# Patient Record
Sex: Female | Born: 1985 | Race: Black or African American | Hispanic: No | Marital: Married | Smoking: Current some day smoker
Health system: Southern US, Community
[De-identification: ages and names within clinical notes are randomized; demographics above are authoritative.]

## PROBLEM LIST (undated history)

## (undated) DIAGNOSIS — R569 Unspecified convulsions: Secondary | ICD-10-CM

---
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
## 2007-04-13 ENCOUNTER — Emergency Department (HOSPITAL_COMMUNITY): Admission: EM | Admit: 2007-04-13 | Discharge: 2007-04-13 | Payer: Self-pay | Admitting: Emergency Medicine

## 2009-06-13 ENCOUNTER — Emergency Department (HOSPITAL_COMMUNITY): Admission: EM | Admit: 2009-06-13 | Discharge: 2009-06-13 | Payer: Self-pay | Admitting: Emergency Medicine

## 2009-08-02 ENCOUNTER — Other Ambulatory Visit: Admission: RE | Admit: 2009-08-02 | Discharge: 2009-08-02 | Payer: Self-pay | Admitting: Obstetrics and Gynecology

## 2010-10-09 LAB — DIFFERENTIAL
Basophils Absolute: 0 10*3/uL (ref 0.0–0.1)
Basophils Relative: 0 % (ref 0–1)
Eosinophils Absolute: 0.1 10*3/uL (ref 0.0–0.7)
Eosinophils Relative: 1 % (ref 0–5)
Lymphocytes Relative: 11 % — ABNORMAL LOW (ref 12–46)
Lymphs Abs: 1.6 10*3/uL (ref 0.7–4.0)
Monocytes Absolute: 0.9 10*3/uL (ref 0.1–1.0)
Monocytes Relative: 6 % (ref 3–12)
Neutro Abs: 11.4 10*3/uL — ABNORMAL HIGH (ref 1.7–7.7)
Neutrophils Relative %: 81 % — ABNORMAL HIGH (ref 43–77)

## 2010-10-09 LAB — URINALYSIS, ROUTINE W REFLEX MICROSCOPIC
Bilirubin Urine: NEGATIVE
Glucose, UA: NEGATIVE mg/dL
Hgb urine dipstick: NEGATIVE
Ketones, ur: >80 mg/dL — AB
Nitrite: NEGATIVE
Protein, ur: NEGATIVE mg/dL
Specific Gravity, Urine: 1.025 (ref 1.005–1.030)
Urobilinogen, UA: 0.2 mg/dL (ref 0.0–1.0)
pH: 6.5 (ref 5.0–8.0)

## 2010-10-09 LAB — BASIC METABOLIC PANEL
BUN: 7 mg/dL (ref 6–23)
CO2: 22 mEq/L (ref 19–32)
Calcium: 9.2 mg/dL (ref 8.4–10.5)
Chloride: 103 mEq/L (ref 96–112)
Creatinine, Ser: 0.53 mg/dL (ref 0.4–1.2)
GFR calc Af Amer: >60 mL/min (ref >60)
GFR calc non Af Amer: >60 mL/min (ref >60)
Glucose, Bld: 78 mg/dL (ref 70–99)
Potassium: 3.6 mEq/L (ref 3.5–5.1)
Sodium: 134 mEq/L — ABNORMAL LOW (ref 135–145)

## 2010-10-09 LAB — CBC
HCT: 34.6 % — ABNORMAL LOW (ref 36.0–46.0)
Hemoglobin: 11.7 g/dL — ABNORMAL LOW (ref 12.0–15.0)
MCHC: 33.8 g/dL (ref 30.0–36.0)
MCV: 80.2 fL (ref 78.0–100.0)
Platelets: 262 10*3/uL (ref 150–400)
RBC: 4.31 MIL/uL (ref 3.87–5.11)
RDW: 17.3 % — ABNORMAL HIGH (ref 11.5–15.5)
WBC: 14 10*3/uL — ABNORMAL HIGH (ref 4.0–10.5)

## 2010-10-09 LAB — PREGNANCY, URINE: Preg Test, Ur: POSITIVE

## 2010-11-25 ENCOUNTER — Emergency Department (HOSPITAL_COMMUNITY): Payer: Self-pay

## 2010-11-25 ENCOUNTER — Emergency Department (HOSPITAL_COMMUNITY)
Admission: EM | Admit: 2010-11-25 | Discharge: 2010-11-25 | Disposition: A | Discharge status: Home / Self Care | Payer: Self-pay | Attending: Emergency Medicine | Admitting: Emergency Medicine

## 2010-11-25 DIAGNOSIS — M79609 Pain in unspecified limb: Secondary | ICD-10-CM | POA: Insufficient documentation

## 2010-11-25 DIAGNOSIS — IMO0002 Reserved for concepts with insufficient information to code with codable children: Secondary | ICD-10-CM | POA: Insufficient documentation

## 2010-11-25 DIAGNOSIS — S60229A Contusion of unspecified hand, initial encounter: Secondary | ICD-10-CM | POA: Insufficient documentation

## 2011-04-15 ENCOUNTER — Other Ambulatory Visit: Payer: Self-pay

## 2011-04-18 LAB — WET PREP, GENITAL
Trich, Wet Prep: NONE SEEN
Yeast Wet Prep HPF POC: NONE SEEN

## 2011-04-18 LAB — URINALYSIS, ROUTINE W REFLEX MICROSCOPIC
Bilirubin Urine: NEGATIVE
Glucose, UA: NEGATIVE
Hgb urine dipstick: NEGATIVE
Ketones, ur: NEGATIVE
Nitrite: NEGATIVE
Protein, ur: NEGATIVE
Specific Gravity, Urine: 1.01
Urobilinogen, UA: 0.2
pH: 6

## 2011-04-18 LAB — GC/CHLAMYDIA PROBE AMP, GENITAL
Chlamydia, DNA Probe: POSITIVE — AB
GC Probe Amp, Genital: NEGATIVE

## 2011-04-18 LAB — PREGNANCY, URINE: Preg Test, Ur: NEGATIVE

## 2011-08-12 ENCOUNTER — Other Ambulatory Visit (HOSPITAL_COMMUNITY): Payer: Self-pay | Admitting: Unknown Physician Specialty

## 2011-08-12 DIAGNOSIS — O2690 Pregnancy related conditions, unspecified, unspecified trimester: Secondary | ICD-10-CM

## 2011-08-13 ENCOUNTER — Encounter (HOSPITAL_COMMUNITY): Payer: Self-pay

## 2011-08-13 ENCOUNTER — Ambulatory Visit (HOSPITAL_COMMUNITY)
Admission: RE | Admit: 2011-08-13 | Discharge: 2011-08-13 | Disposition: A | Discharge status: Home / Self Care | Payer: Medicaid Other | Source: Ambulatory Visit | Attending: Unknown Physician Specialty | Admitting: Unknown Physician Specialty

## 2011-08-13 DIAGNOSIS — Z363 Encounter for antenatal screening for malformations: Secondary | ICD-10-CM | POA: Insufficient documentation

## 2011-08-13 DIAGNOSIS — O358XX0 Maternal care for other (suspected) fetal abnormality and damage, not applicable or unspecified: Secondary | ICD-10-CM | POA: Insufficient documentation

## 2011-08-13 DIAGNOSIS — O34219 Maternal care for unspecified type scar from previous cesarean delivery: Secondary | ICD-10-CM | POA: Insufficient documentation

## 2011-08-13 DIAGNOSIS — Z1389 Encounter for screening for other disorder: Secondary | ICD-10-CM | POA: Insufficient documentation

## 2011-08-13 DIAGNOSIS — O2690 Pregnancy related conditions, unspecified, unspecified trimester: Secondary | ICD-10-CM

## 2011-08-13 NOTE — Progress Notes (Signed)
Sherry Greene was seen for ultrasound appointment today.  Please see [facility] report for details.

## 2011-09-10 ENCOUNTER — Ambulatory Visit (HOSPITAL_COMMUNITY)
Admission: RE | Admit: 2011-09-10 | Payer: Medicaid Other | Source: Ambulatory Visit | Attending: Family Medicine | Admitting: Family Medicine

## 2011-10-02 ENCOUNTER — Ambulatory Visit (HOSPITAL_COMMUNITY): Payer: Medicaid Other

## 2014-03-15 ENCOUNTER — Emergency Department (HOSPITAL_COMMUNITY): Payer: Self-pay

## 2014-03-15 ENCOUNTER — Emergency Department (HOSPITAL_COMMUNITY)
Admission: EM | Admit: 2014-03-15 | Discharge: 2014-03-15 | Disposition: A | Discharge status: Home / Self Care | Payer: Self-pay | Attending: Emergency Medicine | Admitting: Emergency Medicine

## 2014-03-15 ENCOUNTER — Encounter (HOSPITAL_COMMUNITY): Payer: Self-pay | Admitting: Emergency Medicine

## 2014-03-15 DIAGNOSIS — X500XXA Overexertion from strenuous movement or load, initial encounter: Secondary | ICD-10-CM | POA: Insufficient documentation

## 2014-03-15 DIAGNOSIS — R000 Tachycardia, unspecified: Secondary | ICD-10-CM | POA: Insufficient documentation

## 2014-03-15 DIAGNOSIS — Y929 Unspecified place or not applicable: Secondary | ICD-10-CM | POA: Insufficient documentation

## 2014-03-15 DIAGNOSIS — S99919A Unspecified injury of unspecified ankle, initial encounter: Secondary | ICD-10-CM

## 2014-03-15 DIAGNOSIS — S8990XA Unspecified injury of unspecified lower leg, initial encounter: Secondary | ICD-10-CM | POA: Insufficient documentation

## 2014-03-15 DIAGNOSIS — Y9302 Activity, running: Secondary | ICD-10-CM | POA: Insufficient documentation

## 2014-03-15 DIAGNOSIS — S99929A Unspecified injury of unspecified foot, initial encounter: Secondary | ICD-10-CM

## 2014-03-15 DIAGNOSIS — S93402A Sprain of unspecified ligament of left ankle, initial encounter: Secondary | ICD-10-CM

## 2014-03-15 DIAGNOSIS — S93409A Sprain of unspecified ligament of unspecified ankle, initial encounter: Secondary | ICD-10-CM | POA: Insufficient documentation

## 2014-03-15 DIAGNOSIS — F17200 Nicotine dependence, unspecified, uncomplicated: Secondary | ICD-10-CM | POA: Insufficient documentation

## 2014-03-15 MED ORDER — HYDROCODONE-ACETAMINOPHEN 5-325 MG PO TABS: 1.0000 | ORAL_TABLET | ORAL | Status: DC | PRN

## 2014-03-15 MED ORDER — NAPROXEN 500 MG PO TABS: 500.0000 mg | ORAL_TABLET | Freq: Two times a day (BID) | ORAL | Status: DC

## 2014-03-15 NOTE — ED Notes (Signed)
Pt stumbled and now c/o left ankle pain. No swelling noted. Pedal pulses strong. Pt rates pain 10. Did not take anything for pain pta.

## 2014-03-15 NOTE — ED Provider Notes (Signed)
CSN: [id number]     Arrival date & time 03/15/14  1417 History   None    Chief Complaint  Patient presents with  . Ankle Pain     (Consider location/radiation/quality/duration/timing/severity/associated sxs/prior Treatment) Patient is a 28 y.o. female presenting with ankle pain. The history is provided by the patient.  Ankle Pain Location:  Ankle Injury: yes   Ankle location:  L ankle Pain details:    Quality:  Sharp and throbbing   Onset quality:  Sudden   Duration:  4 hours   Progression:  Unchanged Chronicity:  New Dislocation: no   Foreign body present:  No foreign bodies Prior injury to area:  No Relieved by:  None tried Worsened by:  Activity and bearing weight Ineffective treatments:  None tried Associated symptoms: swelling    Sherry Greene is a 28 y.o. female who presents to the ED with left ankle pain. She states that she was running and twisted her left ankle. She complains of swelling. She has taken nothing for pain.   History reviewed. No pertinent past medical history. Past Surgical History  Procedure Laterality Date  . Cesarean section     History reviewed. No pertinent family history. History  Substance Use Topics  . Smoking status: Current Every Day Smoker  . Smokeless tobacco: Not on file  . Alcohol Use: No   OB History   Grav Para Term Preterm Abortions TAB SAB Ect Mult Living       Review of Systems Negative except as stated in HPI   Allergies  Review of patient's allergies indicates no known allergies.  Home Medications   Prior to Admission medications   Medication Sig Start Date End Date Taking? Authorizing Provider  PRENATAL VITAMINS PO Take by mouth.    Historical Provider, MD   BP 110/88  Pulse 126  Temp(Src) 98.3 F (36.8 C) (Oral)  Resp 20  Ht  (1.676 m)  Wt 175 lb (79.379 kg)  BMI 28.26 kg/m2  SpO2 100%  LMP 03/13/2014  Breastfeeding? Unknown Physical Exam  Nursing note and vitals  reviewed. Constitutional: She is oriented to person, place, and time. She appears well-developed and well-nourished. No distress.  HENT:  Head: Normocephalic and atraumatic.  Eyes: EOM are normal.  Neck: Neck supple.  Cardiovascular: Tachycardia present.   Pulmonary/Chest: Effort normal.  Abdominal: Soft. There is no tenderness.  Musculoskeletal: Normal range of motion.       Left ankle: She exhibits swelling (minimal). She exhibits normal range of motion, no ecchymosis, no deformity, no laceration and normal pulse. Tenderness. Lateral malleolus and medial malleolus tenderness found. Achilles tendon normal.  Pedal pulses strong, adequate circulation, good touch sensation.   Neurological: She is alert and oriented to person, place, and time. No cranial nerve deficit.  Skin: Skin is warm and dry.  Psychiatric: She has a normal mood and affect. Her behavior is normal.    ED Course  Procedures (including critical care time) Labs Review Labs Reviewed - No data to display  Imaging Review Dg Ankle Complete Left  03/15/2014   CLINICAL DATA:  Twisted ankle today, now with lateral ankle pain and swelling  EXAM: LEFT ANKLE COMPLETE - 3+ VIEW  COMPARISON:  None.  FINDINGS: There is mild soft tissue swelling about the medial malleolus. No fracture or dislocation. Joint spaces are preserved. Ankle mortise is preserved. No definite ankle joint effusion. No radiopaque foreign body.  IMPRESSION: Mild soft  tissue swelling about the medial malleolus without associated fracture or dislocation.   Electronically Signed   By: Simonne Come M.D.   On: 03/15/2014 14:53     MDM  28 y.o. female with left ankle pain s/p injury. Placed in ASO, crutches to use with ambulation, pain management and follow up with ortho if symptoms persist. I have reviewed this patient's vital signs, nurses notes, appropriate imaging and discussed findings with the patient and plan of care. She voices understanding and agrees with plan.     Medication List         HYDROcodone-acetaminophen 5-325 MG per tablet  Commonly known as:  NORCO/VICODIN  Take 1 tablet by mouth every 4 (four) hours as needed.     naproxen 500 MG tablet  Commonly known as:  NAPROSYN  Take 1 tablet (500 mg total) by mouth 2 (two) times daily.             [facility] Orlene Och, [place] 03/15/14 [phone]

## 2014-03-16 NOTE — ED Provider Notes (Signed)
Medical screening examination/treatment/procedure(s) were performed by non-physician practitioner and as supervising physician I was immediately available for consultation/collaboration.    Jesseca Marsch, MD 03/16/14 0955 

## 2014-05-09 ENCOUNTER — Encounter (HOSPITAL_COMMUNITY): Payer: Self-pay | Admitting: Emergency Medicine

## 2015-06-11 ENCOUNTER — Emergency Department (HOSPITAL_COMMUNITY)
Admission: EM | Admit: 2015-06-11 | Discharge: 2015-06-11 | Disposition: A | Discharge status: Home / Self Care | Payer: Self-pay | Attending: Emergency Medicine | Admitting: Emergency Medicine

## 2015-06-11 ENCOUNTER — Encounter (HOSPITAL_COMMUNITY): Payer: Self-pay | Admitting: Emergency Medicine

## 2015-06-11 ENCOUNTER — Emergency Department (HOSPITAL_COMMUNITY): Payer: Self-pay

## 2015-06-11 DIAGNOSIS — R569 Unspecified convulsions: Secondary | ICD-10-CM | POA: Insufficient documentation

## 2015-06-11 DIAGNOSIS — R51 Headache: Secondary | ICD-10-CM | POA: Insufficient documentation

## 2015-06-11 DIAGNOSIS — F17210 Nicotine dependence, cigarettes, uncomplicated: Secondary | ICD-10-CM | POA: Insufficient documentation

## 2015-06-11 DIAGNOSIS — Z791 Long term (current) use of non-steroidal anti-inflammatories (NSAID): Secondary | ICD-10-CM | POA: Insufficient documentation

## 2015-06-11 LAB — CBC WITH DIFFERENTIAL/PLATELET
Basophils Absolute: 0 10*3/uL (ref 0.0–0.1)
Basophils Relative: 0 %
Eosinophils Absolute: 0.1 10*3/uL (ref 0.0–0.7)
Eosinophils Relative: 1 %
HCT: 36.4 % (ref 36.0–46.0)
Hemoglobin: 12.3 g/dL (ref 12.0–15.0)
Lymphocytes Relative: 18 %
Lymphs Abs: 2.5 10*3/uL (ref 0.7–4.0)
MCH: 28.5 pg (ref 26.0–34.0)
MCHC: 33.8 g/dL (ref 30.0–36.0)
MCV: 84.3 fL (ref 78.0–100.0)
Monocytes Absolute: 1 10*3/uL (ref 0.1–1.0)
Monocytes Relative: 7 %
Neutro Abs: 10.2 10*3/uL — ABNORMAL HIGH (ref 1.7–7.7)
Neutrophils Relative %: 74 %
Platelets: 295 10*3/uL (ref 150–400)
RBC: 4.32 MIL/uL (ref 3.87–5.11)
RDW: 16.4 % — ABNORMAL HIGH (ref 11.5–15.5)
WBC: 13.8 10*3/uL — ABNORMAL HIGH (ref 4.0–10.5)

## 2015-06-11 LAB — BASIC METABOLIC PANEL
Anion gap: 6 (ref 5–15)
BUN: 11 mg/dL (ref 6–20)
CO2: 26 mmol/L (ref 22–32)
Calcium: 9.3 mg/dL (ref 8.9–10.3)
Chloride: 105 mmol/L (ref 101–111)
Creatinine, Ser: 0.67 mg/dL (ref 0.44–1.00)
GFR calc Af Amer: >60 mL/min (ref >60)
GFR calc non Af Amer: >60 mL/min (ref >60)
Glucose, Bld: 87 mg/dL (ref 65–99)
Potassium: 3.9 mmol/L (ref 3.5–5.1)
Sodium: 137 mmol/L (ref 135–145)

## 2015-06-11 MED ORDER — KETOROLAC TROMETHAMINE 30 MG/ML IJ SOLN
15.0000 mg | Freq: Once | INTRAMUSCULAR | Status: AC
  Administered 2015-06-11: 15 mg via INTRAVENOUS
  Filled 2015-06-11: qty 1

## 2015-06-11 NOTE — Discharge Instructions (Signed)

## 2015-06-11 NOTE — ED Provider Notes (Signed)
CSN: [id number]     Arrival date & time 06/11/15  1525 History   First MD Initiated Contact with Patient 06/11/15 1546     Chief Complaint  Patient presents with  . Seizures     (Consider location/radiation/quality/duration/timing/severity/associated sxs/prior Treatment) HPI   29 year old female possible seizure. Happened shortly before arrival. Patient is amnestic to actual events. Apparently she became unresponsive in front of family. Have generalized shaking and seems stiff. Drooling. No incontinence or oral trauma. After shaking subsided patient was nonverbal for ~15 minutes and when she did begin speaking was very confused. Now feels much better and no further complaints. Felt fine when woke up and over the past few days. No fever. Sleeping well. No hx of similar episodes.    History reviewed. No pertinent past medical history. Past Surgical History  Procedure Laterality Date  . Cesarean section     History reviewed. No pertinent family history. Social History  Substance Use Topics  . Smoking status: Current Every Day Smoker    Types: Cigars  . Smokeless tobacco: Never Used  . Alcohol Use: No   OB History    Gravida Para Term Preterm AB TAB SAB Ectopic Multiple Living   2 1 0 0 0 0 0 0 0 1      Review of Systems  All systems reviewed and negative, other than as noted in HPI.   Allergies  Review of patient's allergies indicates no known allergies.  Home Medications   Prior to Admission medications   Medication Sig Start Date End Date Taking? Authorizing Provider  HYDROcodone-acetaminophen (NORCO/VICODIN) 5-325 MG per tablet Take 1 tablet by mouth every 4 (four) hours as needed. 03/15/14   Hope Orlene OchM Neese, NP  naproxen (NAPROSYN) 500 MG tablet Take 1 tablet (500 mg total) by mouth 2 (two) times daily. 03/15/14   Hope Orlene OchM Neese, NP   BP 129/88 mmHg  Pulse 83  Temp(Src) 97.5 F (36.4 C) (Oral)  Resp 20  Ht 5\' 4"  (1.626 m)  Wt 160 lb (72.576 kg)  BMI 27.45 kg/m2  SpO2  100%  LMP 05/19/2015 Physical Exam  Constitutional: She is oriented to person, place, and time. She appears well-developed and well-nourished. No distress.  HENT:  Head: Normocephalic and atraumatic.  Eyes: Conjunctivae and EOM are normal. Pupils are equal, round, and reactive to light. Right eye exhibits no discharge. Left eye exhibits no discharge.  Neck: Neck supple.  No nuchal rigidity  Cardiovascular: Normal rate, regular rhythm and normal heart sounds.  Exam reveals no gallop and no friction rub.   No murmur heard. Pulmonary/Chest: Effort normal and breath sounds normal. No respiratory distress.  Abdominal: Soft. She exhibits no distension. There is no tenderness.  Musculoskeletal: She exhibits no edema or tenderness.  Neurological: She is alert and oriented to person, place, and time. No cranial nerve deficit. She exhibits normal muscle tone. Coordination normal.  Speech clear. Content appropriate. Follows commands. Cranial nerves II through XII are intact. Strength is normal bilateral upper lower extremities. Sensation is intact to light touch. Good finger to nose testing bilaterally. Gait is steady.  Skin: Skin is warm and dry.  Psychiatric: She has a normal mood and affect. Her behavior is normal. Thought content normal.  Nursing note and vitals reviewed.   ED Course  Procedures (including critical care time) Labs Review Labs Reviewed  CBC WITH DIFFERENTIAL/PLATELET - Abnormal; Notable for the following:    WBC 13.8 (*)    RDW 16.4 (*)    Neutro  Abs 10.2 (*)    All other components within normal limits  BASIC METABOLIC PANEL    Imaging Review Ct Head Wo Contrast  06/11/2015  CLINICAL DATA:  Possible seizure. Headache and generalized weakness. EXAM: CT HEAD WITHOUT CONTRAST TECHNIQUE: Contiguous axial images were obtained from the base of the skull through the vertex without intravenous contrast. COMPARISON:  None. FINDINGS: Brain: No evidence of acute infarction,  hemorrhage, extra-axial collection, ventriculomegaly, or mass effect. Cystic structure in the posterior most aspect of the left parietal lobe is noted. Vascular: No hyperdense vessel or unexpected calcification. Skull: Negative for fracture or focal lesion. Sinuses/Orbits: No acute findings. Other: None. IMPRESSION: Normal appearance of the head, apart from cystic structure in the posterior most left parietal lobe, with benign appearance. Differential diagnosis includes subarachnoid cyst versus a dilated vascular space. Electronically Signed   By: Ted Mcalpine M.D.   On: 06/11/2015 17:14   I have personally reviewed and evaluated these images and lab results as part of my medical decision-making.   EKG Interpretation None      MDM   Final diagnoses:  Seizure-like activity (HCC)    29 year old female with what sounds like possibly new-onset seizure. Back to baseline. No further complaints. Nonfocal neurological examination. Workup including CT the head does not show any acute abnormality. Driving restrictions were discussed. Urology follow-up.    Raeford Razor, MD 06/11/15 [phone]

## 2015-06-11 NOTE — ED Notes (Signed)
Patient reports possible seizure. No hx. Per patient syncopal episode with jerking witnessed by friend in which EMS was called but patient was unable to verbally respond to paramedic while there. Per patient headache and generalized weakness.

## 2015-06-11 NOTE — ED Notes (Signed)
Pt d/c papers given and reviewed. Pt. Verbalized understanding.  

## 2015-09-27 ENCOUNTER — Encounter (HOSPITAL_COMMUNITY): Payer: Self-pay | Admitting: *Deleted

## 2015-09-27 DIAGNOSIS — R531 Weakness: Secondary | ICD-10-CM | POA: Insufficient documentation

## 2015-09-27 DIAGNOSIS — R569 Unspecified convulsions: Secondary | ICD-10-CM | POA: Insufficient documentation

## 2015-09-27 DIAGNOSIS — F17210 Nicotine dependence, cigarettes, uncomplicated: Secondary | ICD-10-CM | POA: Insufficient documentation

## 2015-09-27 DIAGNOSIS — Z3202 Encounter for pregnancy test, result negative: Secondary | ICD-10-CM | POA: Insufficient documentation

## 2015-09-27 LAB — BASIC METABOLIC PANEL
ANION GAP: 11 (ref 5–15)
BUN: 12 mg/dL (ref 6–20)
CALCIUM: 9.3 mg/dL (ref 8.9–10.3)
CO2: 22 mmol/L (ref 22–32)
Chloride: 107 mmol/L (ref 101–111)
Creatinine, Ser: 0.73 mg/dL (ref 0.44–1.00)
GLUCOSE: 100 mg/dL — AB (ref 65–99)
Potassium: 3.9 mmol/L (ref 3.5–5.1)
Sodium: 140 mmol/L (ref 135–145)

## 2015-09-27 LAB — CBC
HCT: 34.2 % — ABNORMAL LOW (ref 36.0–46.0)
HEMOGLOBIN: 11.3 g/dL — AB (ref 12.0–15.0)
MCH: 26.9 pg (ref 26.0–34.0)
MCHC: 33 g/dL (ref 30.0–36.0)
MCV: 81.4 fL (ref 78.0–100.0)
Platelets: 295 10*3/uL (ref 150–400)
RBC: 4.2 MIL/uL (ref 3.87–5.11)
RDW: 16.3 % — ABNORMAL HIGH (ref 11.5–15.5)
WBC: 10.1 10*3/uL (ref 4.0–10.5)

## 2015-09-27 LAB — I-STAT BETA HCG BLOOD, ED (MC, WL, AP ONLY)

## 2015-09-27 LAB — CBG MONITORING, ED: Glucose-Capillary: 82 mg/dL (ref 65–99)

## 2015-09-27 NOTE — ED Notes (Signed)
Pt states that she has been having seizures in her sleep for 2 months. States her husband calls the ambulance but she refuses to come to the hospital. States she has been feeling weak after having them.

## 2015-09-28 ENCOUNTER — Emergency Department (HOSPITAL_COMMUNITY)
Admission: EM | Admit: 2015-09-28 | Discharge: 2015-09-28 | Disposition: A | Discharge status: Home / Self Care | Payer: Self-pay | Attending: Emergency Medicine | Admitting: Emergency Medicine

## 2015-09-28 DIAGNOSIS — R569 Unspecified convulsions: Secondary | ICD-10-CM

## 2015-09-28 NOTE — ED Provider Notes (Signed)
CSN: [id number]     Arrival date & time 09/27/15  1945 History  By signing my name below, I, Doreatha MartinEva Mathews, attest that this documentation has been prepared under the direction and in the presence of Marily MemosJason Micky Sheller, MD. Electronically Signed: Doreatha MartinEva Mathews, ED Scribe. 09/28/2015. 12:53 AM.    Chief Complaint  Patient presents with  . Seizures   The history is provided by the patient and the spouse. No language interpreter was used.   HPI Comments: Sherry Greene is a 30 y.o. female who presents to the Emergency Department complaining of multiple episodes of seizure-like activity while sleeping, witnessed by husband, for 2 months (last episode last night). Per husband, the pts torso shakes and her mouth foams during the episodes. He reports that he turns her on her side while she is convulsing. He reports that the seizure activity seems to be related to chocolate consumption before bed. Pt reports transient generalized weakness after seizure activity that lasts hours. She states her husband has called EMS with these episodes and her CBG has been normal, but she has refused to come to the hospital. No h/o similar symptoms prior to 2 months ago. FHx of seizures from grandmother after brain aneurisms. Pt has not been followed by neurology. No recent illness or increased stress. LMP this month, no missed periods. She denies fever, abdominal pain.  History reviewed. No pertinent past medical history. Past Surgical History  Procedure Laterality Date  . Cesarean section     No family history on file. Social History  Substance Use Topics  . Smoking status: Current Every Day Smoker    Types: Cigars  . Smokeless tobacco: Never Used  . Alcohol Use: No   OB History    Gravida Para Term Preterm AB TAB SAB Ectopic Multiple Living   2 1 0 0 0 0 0 0 0 1      Review of Systems  Constitutional: Negative for fever.  Gastrointestinal: Negative for abdominal pain.  Neurological: Positive for seizures (  intermittent) and weakness ( with seizure activity).  All other systems reviewed and are negative.  Allergies  Review of patient's allergies indicates no known allergies.  Home Medications   Prior to Admission medications   Not on File   BP 113/84 mmHg  Pulse 80  Temp(Src) 98.7 F (37.1 C) (Oral)  Resp 20  SpO2 100%  LMP 09/06/2015 Physical Exam  Constitutional: She is oriented to person, place, and time. She appears well-developed and well-nourished.  HENT:  Head: Normocephalic and atraumatic.  Eyes: Conjunctivae and EOM are normal. Pupils are equal, round, and reactive to light.  Neck: Normal range of motion. Neck supple.  Cardiovascular: Regular rhythm and normal heart sounds.  Exam reveals no gallop and no friction rub.   No murmur heard. Pulmonary/Chest: Effort normal and breath sounds normal. No respiratory distress. She has no wheezes. She has no rales.  Abdominal: Soft. Bowel sounds are normal. She exhibits no distension and no mass. There is no tenderness. There is no rebound and no guarding.  Musculoskeletal: Normal range of motion.  Neurological: She is alert and oriented to person, place, and time. She has normal reflexes. She displays normal reflexes. No cranial nerve deficit. Coordination normal.  Cranial nerves 2-12 intact. Normal finger to nose testing. Strength and sensation equal and intact bilaterally throughout the upper and lower extremities.Normal gait.  DTRs 2+ bilaterally in patella in biceps.   Skin: Skin is warm and dry.  Psychiatric: She has a normal mood  and affect. Her behavior is normal.  Nursing note and vitals reviewed.   ED Course  Procedures (including critical care time) DIAGNOSTIC STUDIES: Oxygen Saturation is 100% on RA, normal by my interpretation.    COORDINATION OF CARE: 12:52 AM Discussed treatment plan with pt at bedside which includes lab work, EKG, neurology follow up and pt agreed to plan.   Labs Review Labs Reviewed  BASIC  METABOLIC PANEL - Abnormal; Notable for the following:    Glucose, Bld 100 (*)    All other components within normal limits  CBC - Abnormal; Notable for the following:    Hemoglobin 11.3 (*)    HCT 34.2 (*)    RDW 16.3 (*)    All other components within normal limits  CBG MONITORING, ED  I-STAT BETA HCG BLOOD, ED ([facility], [facility], [facility] ONLY)    I have personally reviewed and evaluated these lab results as part of my medical decision-making.   EKG Interpretation None      MDM   Final diagnoses:  Seizure-like activity (HCC)    Seizure like activity only at night when sleeping. Differential is short (hypoglycemia, seizure, nocturnal myoclonic jerking, MS) so it seems neurology follow up is indicated. Will not start antiepileptics yet.  New Prescriptions: New Prescriptions   No medications on file     I have personally and contemperaneously reviewed labs and imaging and used in my decision making as above.   A medical screening exam was performed and I feel the patient has had an appropriate workup for their chief complaint at this time and likelihood of emergent condition existing is low. Their vital signs are stable. They have been counseled on decision, discharge, follow up and which symptoms necessitate immediate return to the emergency department.  They verbally stated understanding and agreement with plan and discharged in stable condition.   I personally performed the services described in this documentation, which was scribed in my presence. The recorded information has been reviewed and is accurate.    Marily Memos, MD 09/28/15 [phone]

## 2015-10-04 ENCOUNTER — Emergency Department (HOSPITAL_COMMUNITY)
Admission: EM | Admit: 2015-10-04 | Discharge: 2015-10-04 | Disposition: A | Discharge status: Home / Self Care | Payer: Self-pay | Attending: Dermatology | Admitting: Dermatology

## 2015-10-04 ENCOUNTER — Encounter (HOSPITAL_COMMUNITY): Payer: Self-pay | Admitting: Emergency Medicine

## 2015-10-04 DIAGNOSIS — Z5321 Procedure and treatment not carried out due to patient leaving prior to being seen by health care provider: Secondary | ICD-10-CM | POA: Insufficient documentation

## 2015-10-04 DIAGNOSIS — R569 Unspecified convulsions: Secondary | ICD-10-CM | POA: Insufficient documentation

## 2015-10-04 DIAGNOSIS — F17210 Nicotine dependence, cigarettes, uncomplicated: Secondary | ICD-10-CM | POA: Insufficient documentation

## 2015-10-04 HISTORY — DX: Unspecified convulsions: R56.9

## 2015-10-04 LAB — CBC WITH DIFFERENTIAL/PLATELET
BASOS PCT: 0 %
Basophils Absolute: 0 10*3/uL (ref 0.0–0.1)
EOS PCT: 1 %
Eosinophils Absolute: 0.2 10*3/uL (ref 0.0–0.7)
HEMATOCRIT: 33.4 % — AB (ref 36.0–46.0)
Hemoglobin: 11.3 g/dL — ABNORMAL LOW (ref 12.0–15.0)
Lymphocytes Relative: 21 %
Lymphs Abs: 2.4 10*3/uL (ref 0.7–4.0)
MCH: 28 pg (ref 26.0–34.0)
MCHC: 33.8 g/dL (ref 30.0–36.0)
MCV: 82.9 fL (ref 78.0–100.0)
MONO ABS: 0.9 10*3/uL (ref 0.1–1.0)
MONOS PCT: 8 %
NEUTROS ABS: 7.8 10*3/uL — AB (ref 1.7–7.7)
Neutrophils Relative %: 70 %
PLATELETS: 268 10*3/uL (ref 150–400)
RBC: 4.03 MIL/uL (ref 3.87–5.11)
RDW: 16.8 % — AB (ref 11.5–15.5)
WBC: 11.2 10*3/uL — ABNORMAL HIGH (ref 4.0–10.5)

## 2015-10-04 LAB — BASIC METABOLIC PANEL
ANION GAP: 7 (ref 5–15)
BUN: 12 mg/dL (ref 6–20)
CALCIUM: 9 mg/dL (ref 8.9–10.3)
CHLORIDE: 106 mmol/L (ref 101–111)
CO2: 25 mmol/L (ref 22–32)
Creatinine, Ser: 0.72 mg/dL (ref 0.44–1.00)
GFR calc non Af Amer: >60 mL/min (ref >60)
Glucose, Bld: 98 mg/dL (ref 65–99)
POTASSIUM: 3.8 mmol/L (ref 3.5–5.1)
Sodium: 138 mmol/L (ref 135–145)

## 2015-10-04 NOTE — ED Notes (Signed)
Pt was called no answer 

## 2015-10-04 NOTE — ED Notes (Signed)
Pt left facility per registration. 

## 2015-10-04 NOTE — ED Notes (Signed)
Pt states she has been having seizures 2-3 months now regularly. EMS was called out today and she refused to be transferred to facility.

## 2016-01-03 ENCOUNTER — Emergency Department (HOSPITAL_COMMUNITY)
Admission: EM | Admit: 2016-01-03 | Discharge: 2016-01-03 | Disposition: A | Discharge status: Home / Self Care | Payer: Self-pay | Attending: Emergency Medicine | Admitting: Emergency Medicine

## 2016-01-03 ENCOUNTER — Encounter (HOSPITAL_COMMUNITY): Payer: Self-pay | Admitting: Emergency Medicine

## 2016-01-03 DIAGNOSIS — Z5321 Procedure and treatment not carried out due to patient leaving prior to being seen by health care provider: Secondary | ICD-10-CM | POA: Insufficient documentation

## 2016-01-03 DIAGNOSIS — G40909 Epilepsy, unspecified, not intractable, without status epilepticus: Secondary | ICD-10-CM | POA: Insufficient documentation

## 2016-01-03 DIAGNOSIS — F17210 Nicotine dependence, cigarettes, uncomplicated: Secondary | ICD-10-CM | POA: Insufficient documentation

## 2016-01-03 LAB — CBG MONITORING, ED: Glucose-Capillary: 86 mg/dL (ref 65–99)

## 2016-01-03 NOTE — ED Notes (Signed)
Called for pt, no response, asked registration and reported that pt had left

## 2016-01-03 NOTE — ED Notes (Addendum)
Pt reports having a witnessed seizure while asleep in her bed. When asked how patient knew she had a seizure, pt reported that her children have witnessed them. States that she has been evaluated here multiple times, but told there is nothing wrong. Pt AOx4. Denies any incontinence. States she does not take seizure medications because "no one will help me." C/o HA and generalized weakness.

## 2016-02-21 ENCOUNTER — Encounter (HOSPITAL_COMMUNITY): Payer: Self-pay | Admitting: Cardiology

## 2016-02-21 ENCOUNTER — Emergency Department (HOSPITAL_COMMUNITY)
Admission: EM | Admit: 2016-02-21 | Discharge: 2016-02-21 | Disposition: A | Discharge status: Home / Self Care | Payer: Self-pay | Attending: Emergency Medicine | Admitting: Emergency Medicine

## 2016-02-21 ENCOUNTER — Emergency Department (HOSPITAL_COMMUNITY): Payer: Self-pay

## 2016-02-21 DIAGNOSIS — Z87891 Personal history of nicotine dependence: Secondary | ICD-10-CM | POA: Insufficient documentation

## 2016-02-21 DIAGNOSIS — G40909 Epilepsy, unspecified, not intractable, without status epilepticus: Secondary | ICD-10-CM | POA: Insufficient documentation

## 2016-02-21 DIAGNOSIS — R569 Unspecified convulsions: Secondary | ICD-10-CM

## 2016-02-21 LAB — URINE MICROSCOPIC-ADD ON

## 2016-02-21 LAB — PREGNANCY, URINE: PREG TEST UR: NEGATIVE

## 2016-02-21 LAB — I-STAT CHEM 8, ED
BUN: 13 mg/dL (ref 6–20)
CALCIUM ION: 1.15 mmol/L (ref 1.13–1.30)
CHLORIDE: 105 mmol/L (ref 101–111)
Creatinine, Ser: 0.7 mg/dL (ref 0.44–1.00)
Glucose, Bld: 80 mg/dL (ref 65–99)
HEMATOCRIT: 32 % — AB (ref 36.0–46.0)
HEMOGLOBIN: 10.9 g/dL — AB (ref 12.0–15.0)
Potassium: 3.2 mmol/L — ABNORMAL LOW (ref 3.5–5.1)
Sodium: 142 mmol/L (ref 135–145)
TCO2: 23 mmol/L (ref 0–100)

## 2016-02-21 LAB — URINALYSIS, ROUTINE W REFLEX MICROSCOPIC
BILIRUBIN URINE: NEGATIVE
Glucose, UA: NEGATIVE mg/dL
KETONES UR: NEGATIVE mg/dL
Leukocytes, UA: NEGATIVE
NITRITE: NEGATIVE
Specific Gravity, Urine: 1.02 (ref 1.005–1.030)
pH: 6 (ref 5.0–8.0)

## 2016-02-21 MED ORDER — KETOROLAC TROMETHAMINE 60 MG/2ML IM SOLN
60.0000 mg | Freq: Once | INTRAMUSCULAR | Status: AC
  Administered 2016-02-21: 60 mg via INTRAMUSCULAR
  Filled 2016-02-21: qty 2

## 2016-02-21 MED ORDER — METOCLOPRAMIDE HCL 5 MG/ML IJ SOLN
10.0000 mg | Freq: Once | INTRAMUSCULAR | Status: AC
  Administered 2016-02-21: 10 mg via INTRAMUSCULAR
  Filled 2016-02-21: qty 2

## 2016-02-21 MED ORDER — LEVETIRACETAM 500 MG PO TABS: 500.0000 mg | ORAL_TABLET | Freq: Two times a day (BID) | ORAL | 0 refills | Status: DC

## 2016-02-21 MED ORDER — DIPHENHYDRAMINE HCL 25 MG PO CAPS
50.0000 mg | ORAL_CAPSULE | Freq: Once | ORAL | Status: AC
  Administered 2016-02-21: 50 mg via ORAL
  Filled 2016-02-21: qty 2

## 2016-02-21 MED ORDER — POTASSIUM CHLORIDE CRYS ER 20 MEQ PO TBCR
40.0000 meq | EXTENDED_RELEASE_TABLET | Freq: Once | ORAL | Status: AC
  Administered 2016-02-21: 40 meq via ORAL
  Filled 2016-02-21: qty 2
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
## 2016-02-21 NOTE — ED Notes (Signed)
Taken to ct  
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
## 2016-02-21 NOTE — ED Notes (Signed)
Dr Gerilyn Pilgrim[place]ah called back and wanted pt to be started on medication, keppra 500mg  by bid. 30 tabs per rx. No refills. By dr. Clarene DukeMcManus. Dc papers with charge nurse. Pt called back and message left.

## 2016-02-21 NOTE — ED Triage Notes (Signed)
C/o seizure occurred 2 times last night and once this morning

## 2016-02-21 NOTE — Discharge Instructions (Signed)
Take the prescription as directed.  Call the Neurologist today to schedule a follow up appointment within the next week.  Return to the Emergency Department immediately sooner if worsening.  ° °

## 2016-02-21 NOTE — ED Notes (Signed)
Attempted to call pt back. Still no answer. Message left.

## 2016-02-21 NOTE — ED Notes (Signed)
Pt called again. No answer. Left message. Advised she has rx here and to come to ED and ask for Charge nurse.

## 2016-02-21 NOTE — ED Notes (Signed)
No returen call from Dr. Gerilyn Pilgrim[place]quah. Pt called, did not answer.advised to call back and gave number to ED. Also stated we wanted her to fu with Dr. Gerilyn Pilgrim[place]quah and will give information

## 2017-04-14 ENCOUNTER — Ambulatory Visit (INDEPENDENT_AMBULATORY_CARE_PROVIDER_SITE_OTHER): Payer: BLUE CROSS/BLUE SHIELD | Admitting: Adult Health

## 2017-04-14 ENCOUNTER — Encounter (INDEPENDENT_AMBULATORY_CARE_PROVIDER_SITE_OTHER): Payer: Self-pay

## 2017-04-14 ENCOUNTER — Encounter: Payer: BLUE CROSS/BLUE SHIELD | Admitting: Adult Health

## 2017-04-14 ENCOUNTER — Encounter: Payer: Self-pay | Admitting: Adult Health

## 2017-04-14 VITALS — BP 120/70 | HR 79 | Resp 18 | Ht 65.0 in | Wt 200.0 lb

## 2017-04-14 DIAGNOSIS — O3680X0 Pregnancy with inconclusive fetal viability, not applicable or unspecified: Secondary | ICD-10-CM | POA: Insufficient documentation

## 2017-04-14 DIAGNOSIS — Z3201 Encounter for pregnancy test, result positive: Secondary | ICD-10-CM | POA: Diagnosis not present

## 2017-04-14 DIAGNOSIS — R112 Nausea with vomiting, unspecified: Secondary | ICD-10-CM

## 2017-04-14 DIAGNOSIS — Z87898 Personal history of other specified conditions: Secondary | ICD-10-CM | POA: Diagnosis not present

## 2017-04-14 DIAGNOSIS — Z3A08 8 weeks gestation of pregnancy: Secondary | ICD-10-CM

## 2017-04-14 DIAGNOSIS — N926 Irregular menstruation, unspecified: Secondary | ICD-10-CM | POA: Diagnosis not present

## 2017-04-14 LAB — POCT URINE PREGNANCY: PREG TEST UR: POSITIVE — AB

## 2017-04-14 MED ORDER — FLINTSTONES COMPLETE 60 MG PO CHEW: 1.0000 | CHEWABLE_TABLET | Freq: Every day | ORAL | Status: DC

## 2017-04-14 NOTE — Progress Notes (Signed)
Subjective:     Patient ID: Sherry Greene, female   DOB: 1986/02/10, 31 y.o.   MRN: [id number]  HPI Sherry Greene is a 31 year old black female, married, in for UPT, has missed a period and had 2 +HPT. Has had some nausea and vomiting.Has had seizures in the past and is not on meds, says she plans to see neurologist in near future, was waiting on insurance, last seizure 2-3 months ago. Has had 2 C sections at [facility].   Review of Systems +missed period with 2+HPT +nausea and vomiting Reviewed past medical,surgical, social and family history. Reviewed medications and allergies.     Objective:   Physical Exam BP 120/70 (BP Location: Left Arm, Patient Position: Sitting, Cuff Size: Normal)   Pulse 79   Resp 18   Ht  (1.651 m)   Wt 200 lb (90.7 kg)   LMP 02/17/2017   SpO2 96%   BMI 33.28 kg/m UPT +, about 8 weeks by LMP with EDD 11/24/17,Skin warm and dry. Neck: mid line trachea, normal thyroid, good ROM, no lymphadenopathy noted. Lungs: clear to ausculation bilaterally. Cardiovascular: regular rate and rhythm.Abdomen is soft and non tender.Pt aware babies delivered at [facility] and about after hours call service.     Assessment:        1. Positive pregnancy test   2. [redacted] weeks gestation of pregnancy   3. Encounter to determine fetal viability of pregnancy, single or unspecified fetus   4. History of seizures    Plan:     Take flintstones Eat often Return in 1 week for dating [place] Review handouts on first trimester and by [facility]

## 2017-04-14 NOTE — Patient Instructions (Signed)
First Trimester of Pregnancy The first trimester of pregnancy is from week 1 until the end of week 13 (months 1 through 3). A week after a sperm fertilizes an egg, the egg will implant on the wall of the uterus. This embryo will begin to develop into a baby. Genes from you and your partner will form the baby. The female genes will determine whether the baby will be a boy or a girl. At 6-8 weeks, the eyes and face will be formed, and the heartbeat can be seen on ultrasound. At the end of 12 weeks, all the baby's organs will be formed. Now that you are pregnant, you will want to do everything you can to have a healthy baby. Two of the most important things are to get good prenatal care and to follow your health care provider's instructions. Prenatal care is all the medical care you receive before the baby's birth. This care will help prevent, find, and treat any problems during the pregnancy and childbirth. Body changes during your first trimester Your body goes through many changes during pregnancy. The changes vary from woman to woman.  You may gain or lose a couple of pounds at first.  You may feel sick to your stomach (nauseous) and you may throw up (vomit). If the vomiting is uncontrollable, call your health care provider.  You may tire easily.  You may develop headaches that can be relieved by medicines. All medicines should be approved by your health care provider.  You may urinate more often. Painful urination may mean you have a bladder infection.  You may develop heartburn as a result of your pregnancy.  You may develop constipation because certain hormones are causing the muscles that push stool through your intestines to slow down.  You may develop hemorrhoids or swollen veins (varicose veins).  Your breasts may begin to grow larger and become tender. Your nipples may stick out more, and the tissue that surrounds them (areola) may become darker.  Your gums may bleed and may be  sensitive to brushing and flossing.  Dark spots or blotches (chloasma, mask of pregnancy) may develop on your face. This will likely fade after the baby is born.  Your menstrual periods will stop.  You may have a loss of appetite.  You may develop cravings for certain kinds of food.  You may have changes in your emotions from day to day, such as being excited to be pregnant or being concerned that something may go wrong with the pregnancy and baby.  You may have more vivid and strange dreams.  You may have changes in your hair. These can include thickening of your hair, rapid growth, and changes in texture. Some women also have hair loss during or after pregnancy, or hair that feels dry or thin. Your hair will most likely return to normal after your baby is born.  What to expect at prenatal visits During a routine prenatal visit:  You will be weighed to make sure you and the baby are growing normally.  Your blood pressure will be taken.  Your abdomen will be measured to track your baby's growth.  The fetal heartbeat will be listened to between weeks 10 and 14 of your pregnancy.  Test results from any previous visits will be discussed.  Your health care provider may ask you:  How you are feeling.  If you are feeling the baby move.  If you have had any abnormal symptoms, such as leaking fluid, bleeding, severe headaches,   or abdominal cramping.  If you are using any tobacco products, including cigarettes, chewing tobacco, and electronic cigarettes.  If you have any questions.  Other tests that may be performed during your first trimester include:  Blood tests to find your blood type and to check for the presence of any previous infections. The tests will also be used to check for low iron levels (anemia) and protein on red blood cells (Rh antibodies). Depending on your risk factors, or if you previously had diabetes during pregnancy, you may have tests to check for high blood  sugar that affects pregnant women (gestational diabetes).  Urine tests to check for infections, diabetes, or protein in the urine.  An ultrasound to confirm the proper growth and development of the baby.  Fetal screens for spinal cord problems (spina bifida) and Down syndrome.  HIV (human immunodeficiency virus) testing. Routine prenatal testing includes screening for HIV, unless you choose not to have this test.  You may need other tests to make sure you and the baby are doing well.  Follow these instructions at home: Medicines  Follow your health care provider's instructions regarding medicine use. Specific medicines may be either safe or unsafe to take during pregnancy.  Take a prenatal vitamin that contains at least 600 micrograms (mcg) of folic acid.  If you develop constipation, try taking a stool softener if your health care provider approves. Eating and drinking  Eat a balanced diet that includes fresh fruits and vegetables, whole grains, good sources of protein such as meat, eggs, or tofu, and low-fat dairy. Your health care provider will help you determine the amount of weight gain that is right for you.  Avoid raw meat and uncooked cheese. These carry germs that can cause birth defects in the baby.  Eating four or five small meals rather than three large meals a day may help relieve nausea and vomiting. If you start to feel nauseous, eating a few soda crackers can be helpful. Drinking liquids between meals, instead of during meals, also seems to help ease nausea and vomiting.  Limit foods that are high in fat and processed sugars, such as fried and sweet foods.  To prevent constipation: ? Eat foods that are high in fiber, such as fresh fruits and vegetables, whole grains, and beans. ? Drink enough fluid to keep your urine clear or pale yellow. Activity  Exercise only as directed by your health care provider. Most women can continue their usual exercise routine during  pregnancy. Try to exercise for 30 minutes at least 5 days a week. Exercising will help you: ? Control your weight. ? Stay in shape. ? Be prepared for labor and delivery.  Experiencing pain or cramping in the lower abdomen or lower back is a good sign that you should stop exercising. Check with your health care provider before continuing with normal exercises.  Try to avoid standing for long periods of time. Move your legs often if you must stand in one place for a long time.  Avoid heavy lifting.  Wear low-heeled shoes and practice good posture.  You may continue to have sex unless your health care provider tells you not to. Relieving pain and discomfort  Wear a good support bra to relieve breast tenderness.  Take warm sitz baths to soothe any pain or discomfort caused by hemorrhoids. Use hemorrhoid cream if your health care provider approves.  Rest with your legs elevated if you have leg cramps or low back pain.  If you develop   varicose veins in your legs, wear support hose. Elevate your feet for 15 minutes, 3-4 times a day. Limit salt in your diet. Prenatal care  Schedule your prenatal visits by the twelfth week of pregnancy. They are usually scheduled monthly at first, then more often in the last 2 months before delivery.  Write down your questions. Take them to your prenatal visits.  Keep all your prenatal visits as told by your health care provider. This is important. Safety  Wear your seat belt at all times when driving.  Make a list of emergency phone numbers, including numbers for family, friends, the hospital, and police and fire departments. General instructions  Ask your health care provider for a referral to a local prenatal education class. Begin classes no later than the beginning of month 6 of your pregnancy.  Ask for help if you have counseling or nutritional needs during pregnancy. Your health care provider can offer advice or refer you to specialists for help  with various needs.  Do not use hot tubs, steam rooms, or saunas.  Do not douche or use tampons or scented sanitary pads.  Do not cross your legs for long periods of time.  Avoid cat litter boxes and soil used by cats. These carry germs that can cause birth defects in the baby and possibly loss of the fetus by miscarriage or stillbirth.  Avoid all smoking, herbs, alcohol, and medicines not prescribed by your health care provider. Chemicals in these products affect the formation and growth of the baby.  Do not use any products that contain nicotine or tobacco, such as cigarettes and e-cigarettes. If you need help quitting, ask your health care provider. You may receive counseling support and other resources to help you quit.  Schedule a dentist appointment. At home, brush your teeth with a soft toothbrush and be gentle when you floss. Contact a health care provider if:  You have dizziness.  You have mild pelvic cramps, pelvic pressure, or nagging pain in the abdominal area.  You have persistent nausea, vomiting, or diarrhea.  You have a bad smelling vaginal discharge.  You have pain when you urinate.  You notice increased swelling in your face, hands, legs, or ankles.  You are exposed to fifth disease or chickenpox.  You are exposed to German measles (rubella) and have never had it. Get help right away if:  You have a fever.  You are leaking fluid from your vagina.  You have spotting or bleeding from your vagina.  You have severe abdominal cramping or pain.  You have rapid weight gain or loss.  You vomit blood or material that looks like coffee grounds.  You develop a severe headache.  You have shortness of breath.  You have any kind of trauma, such as from a fall or a car accident. Summary  The first trimester of pregnancy is from week 1 until the end of week 13 (months 1 through 3).  Your body goes through many changes during pregnancy. The changes vary from  woman to woman.  You will have routine prenatal visits. During those visits, your health care provider will examine you, discuss any test results you may have, and talk with you about how you are feeling. This information is not intended to replace advice given to you by your health care provider. Make sure you discuss any questions you have with your health care provider. Document Released: 06/18/2001 Document Revised: 06/05/2016 Document Reviewed: 06/05/2016 Elsevier Interactive Patient Education  2017 [employer].  

## 2017-04-18 ENCOUNTER — Other Ambulatory Visit: Payer: Self-pay | Admitting: Obstetrics & Gynecology

## 2017-04-18 DIAGNOSIS — O3680X0 Pregnancy with inconclusive fetal viability, not applicable or unspecified: Secondary | ICD-10-CM

## 2017-04-21 ENCOUNTER — Ambulatory Visit (INDEPENDENT_AMBULATORY_CARE_PROVIDER_SITE_OTHER): Payer: BLUE CROSS/BLUE SHIELD

## 2017-04-21 DIAGNOSIS — Z3A10 10 weeks gestation of pregnancy: Secondary | ICD-10-CM

## 2017-04-21 DIAGNOSIS — O3680X0 Pregnancy with inconclusive fetal viability, not applicable or unspecified: Secondary | ICD-10-CM

## 2017-04-21 NOTE — Progress Notes (Signed)
[place] 10+1 wks,single IUP,positive fht 169 bpm,normal ovaries bilat,crl 33.55 mm,EDD 11/16/2017

## 2017-04-23 ENCOUNTER — Encounter: Payer: BLUE CROSS/BLUE SHIELD | Admitting: Advanced Practice Midwife

## 2017-04-23 ENCOUNTER — Ambulatory Visit: Payer: BLUE CROSS/BLUE SHIELD | Admitting: *Deleted

## 2017-04-24 ENCOUNTER — Ambulatory Visit: Payer: BLUE CROSS/BLUE SHIELD | Admitting: *Deleted

## 2017-05-01 ENCOUNTER — Encounter: Payer: BLUE CROSS/BLUE SHIELD | Admitting: Advanced Practice Midwife

## 2017-05-01 ENCOUNTER — Ambulatory Visit: Payer: BLUE CROSS/BLUE SHIELD | Admitting: *Deleted

## 2017-05-15 ENCOUNTER — Encounter: Payer: BLUE CROSS/BLUE SHIELD | Admitting: Women's Health

## 2017-05-15 ENCOUNTER — Ambulatory Visit: Payer: BLUE CROSS/BLUE SHIELD | Admitting: *Deleted

## 2017-06-18 ENCOUNTER — Ambulatory Visit: Payer: BLUE CROSS/BLUE SHIELD | Admitting: *Deleted

## 2017-06-18 ENCOUNTER — Encounter: Payer: Self-pay | Admitting: Women's Health

## 2017-06-18 ENCOUNTER — Other Ambulatory Visit (HOSPITAL_COMMUNITY)
Admission: RE | Admit: 2017-06-18 | Discharge: 2017-06-18 | Disposition: A | Discharge status: Home / Self Care | Payer: BLUE CROSS/BLUE SHIELD | Source: Ambulatory Visit | Attending: Obstetrics & Gynecology | Admitting: Obstetrics & Gynecology

## 2017-06-18 ENCOUNTER — Ambulatory Visit (INDEPENDENT_AMBULATORY_CARE_PROVIDER_SITE_OTHER): Payer: BLUE CROSS/BLUE SHIELD | Admitting: Women's Health

## 2017-06-18 VITALS — BP 120/92 | HR 82 | Wt 201.0 lb

## 2017-06-18 DIAGNOSIS — N760 Acute vaginitis: Secondary | ICD-10-CM

## 2017-06-18 DIAGNOSIS — O0932 Supervision of pregnancy with insufficient antenatal care, second trimester: Secondary | ICD-10-CM

## 2017-06-18 DIAGNOSIS — Z124 Encounter for screening for malignant neoplasm of cervix: Secondary | ICD-10-CM

## 2017-06-18 DIAGNOSIS — O99322 Drug use complicating pregnancy, second trimester: Secondary | ICD-10-CM | POA: Diagnosis not present

## 2017-06-18 DIAGNOSIS — Z3482 Encounter for supervision of other normal pregnancy, second trimester: Secondary | ICD-10-CM | POA: Insufficient documentation

## 2017-06-18 DIAGNOSIS — O26892 Other specified pregnancy related conditions, second trimester: Secondary | ICD-10-CM

## 2017-06-18 DIAGNOSIS — Z8279 Family history of other congenital malformations, deformations and chromosomal abnormalities: Secondary | ICD-10-CM

## 2017-06-18 DIAGNOSIS — O0930 Supervision of pregnancy with insufficient antenatal care, unspecified trimester: Secondary | ICD-10-CM | POA: Insufficient documentation

## 2017-06-18 DIAGNOSIS — N898 Other specified noninflammatory disorders of vagina: Secondary | ICD-10-CM

## 2017-06-18 DIAGNOSIS — F1290 Cannabis use, unspecified, uncomplicated: Secondary | ICD-10-CM

## 2017-06-18 DIAGNOSIS — Z3490 Encounter for supervision of normal pregnancy, unspecified, unspecified trimester: Secondary | ICD-10-CM | POA: Insufficient documentation

## 2017-06-18 DIAGNOSIS — O34219 Maternal care for unspecified type scar from previous cesarean delivery: Secondary | ICD-10-CM

## 2017-06-18 DIAGNOSIS — Z8759 Personal history of other complications of pregnancy, childbirth and the puerperium: Secondary | ICD-10-CM

## 2017-06-18 DIAGNOSIS — B9689 Other specified bacterial agents as the cause of diseases classified elsewhere: Secondary | ICD-10-CM

## 2017-06-18 DIAGNOSIS — Z331 Pregnant state, incidental: Secondary | ICD-10-CM

## 2017-06-18 DIAGNOSIS — Z87898 Personal history of other specified conditions: Secondary | ICD-10-CM

## 2017-06-18 DIAGNOSIS — Z23 Encounter for immunization: Secondary | ICD-10-CM

## 2017-06-18 DIAGNOSIS — Z3A18 18 weeks gestation of pregnancy: Secondary | ICD-10-CM | POA: Diagnosis not present

## 2017-06-18 DIAGNOSIS — Z98891 History of uterine scar from previous surgery: Secondary | ICD-10-CM | POA: Insufficient documentation

## 2017-06-18 DIAGNOSIS — Z1389 Encounter for screening for other disorder: Secondary | ICD-10-CM

## 2017-06-18 LAB — POCT WET PREP (WET MOUNT)
Clue Cells Wet Prep Whiff POC: POSITIVE
TRICHOMONAS WET PREP HPF POC: ABSENT

## 2017-06-18 LAB — POCT URINALYSIS DIPSTICK
GLUCOSE UA: NEGATIVE
Ketones, UA: NEGATIVE
LEUKOCYTES UA: NEGATIVE
NITRITE UA: NEGATIVE
Protein, UA: NEGATIVE
RBC UA: NEGATIVE

## 2017-06-18 MED ORDER — CITRANATAL ASSURE 35-1 & 300 MG PO MISC: ORAL | 11 refills | Status: DC

## 2017-06-18 MED ORDER — PROMETHAZINE HCL 25 MG PO TABS
12.5000 mg | ORAL_TABLET | Freq: Four times a day (QID) | ORAL | 0 refills | Status: DC | PRN
Start: 2017-06-18 — End: 2017-11-12

## 2017-06-18 MED ORDER — METRONIDAZOLE 500 MG PO TABS: 500.0000 mg | ORAL_TABLET | Freq: Two times a day (BID) | ORAL | 0 refills | Status: DC

## 2017-06-18 NOTE — Patient Instructions (Addendum)
Sherry Greene, I greatly value your feedback.  If you receive a survey following your visit with [place] today, we appreciate you taking the time to fill it out.  Thanks, Joellyn Haff, CNM, WHNP-BC  Call the neurologist on Monday to schedule an appointment  Begin taking a 81mg  baby aspirin daily to decrease risk of preeclampsia during pregnancy   Nausea & Vomiting  Have saltine crackers or pretzels by your bed and eat a few bites before you raise your head out of bed in the morning  Eat small frequent meals throughout the day instead of large meals  Drink plenty of fluids throughout the day to stay hydrated, just don't drink a lot of fluids with your meals.  This can make your stomach fill up faster making you feel sick  Do not brush your teeth right after you eat  Products with real ginger are good for nausea, like ginger ale and ginger hard candy Make sure it says made with real ginger!  Sucking on sour candy like lemon heads is also good for nausea  If your prenatal vitamins make you nauseated, take them at night so you will sleep through the nausea  Sea Bands  If you feel like you need medicine for the nausea & vomiting please let us know  If you are unable to keep any fluids or food down please let us know      Second Trimester of Pregnancy The second trimester is from week 14 through week 27 (months 4 through 6). The second trimester is often a time when you feel your best. Your body has adjusted to being pregnant, and you begin to feel better physically. Usually, morning sickness has lessened or quit completely, you may have more energy, and you may have an increase in appetite. The second trimester is also a time when the fetus is growing rapidly. At the end of the sixth month, the fetus is about 9 inches long and weighs about 1 pounds. You will likely begin to feel the baby move (quickening) between 16 and 20 weeks of pregnancy. Body changes during your second trimester Your  body continues to go through many changes during your second trimester. The changes vary from woman to woman.  Your weight will continue to increase. You will notice your lower abdomen bulging out.  You may begin to get stretch marks on your hips, abdomen, and breasts.  You may develop headaches that can be relieved by medicines. The medicines should be approved by your health care provider.  You may urinate more often because the fetus is pressing on your bladder.  You may develop or continue to have heartburn as a result of your pregnancy.  You may develop constipation because certain hormones are causing the muscles that push waste through your intestines to slow down.  You may develop hemorrhoids or swollen, bulging veins (varicose veins).  You may have back pain. This is caused by: ? Weight gain. ? Pregnancy hormones that are relaxing the joints in your pelvis. ? A shift in weight and the muscles that support your balance.  Your breasts will continue to grow and they will continue to become tender.  Your gums may bleed and may be sensitive to brushing and flossing.  Dark spots or blotches (chloasma, mask of pregnancy) may develop on your face. This will likely fade after the baby is born.  A dark line from your belly button to the pubic area (linea nigra) may appear. This will likely fade  after the baby is born.  You may have changes in your hair. These can include thickening of your hair, rapid growth, and changes in texture. Some women also have hair loss during or after pregnancy, or hair that feels dry or thin. Your hair will most likely return to normal after your baby is born.  What to expect at prenatal visits During a routine prenatal visit:  You will be weighed to make sure you and the fetus are growing normally.  Your blood pressure will be taken.  Your abdomen will be measured to track your baby's growth.  The fetal heartbeat will be listened to.  Any test  results from the previous visit will be discussed.  Your health care provider may ask you:  How you are feeling.  If you are feeling the baby move.  If you have had any abnormal symptoms, such as leaking fluid, bleeding, severe headaches, or abdominal cramping.  If you are using any tobacco products, including cigarettes, chewing tobacco, and electronic cigarettes.  If you have any questions.  Other tests that may be performed during your second trimester include:  Blood tests that check for: ? Low iron levels (anemia). ? High blood sugar that affects pregnant women (gestational diabetes) between 3424 and 28 weeks. ? Rh antibodies. This is to check for a protein on red blood cells (Rh factor).  Urine tests to check for infections, diabetes, or protein in the urine.  An ultrasound to confirm the proper growth and development of the baby.  An amniocentesis to check for possible genetic problems.  Fetal screens for spina bifida and Down syndrome.  HIV (human immunodeficiency virus) testing. Routine prenatal testing includes screening for HIV, unless you choose not to have this test.  Follow these instructions at home: Medicines  Follow your health care provider's instructions regarding medicine use. Specific medicines may be either safe or unsafe to take during pregnancy.  Take a prenatal vitamin that contains at least 600 micrograms (mcg) of folic acid.  If you develop constipation, try taking a stool softener if your health care provider approves. Eating and drinking  Eat a balanced diet that includes fresh fruits and vegetables, whole grains, good sources of protein such as meat, eggs, or tofu, and low-fat dairy. Your health care provider will help you determine the amount of weight gain that is right for you.  Avoid raw meat and uncooked cheese. These carry germs that can cause birth defects in the baby.  If you have low calcium intake from food, talk to your health care  provider about whether you should take a daily calcium supplement.  Limit foods that are high in fat and processed sugars, such as fried and sweet foods.  To prevent constipation: ? Drink enough fluid to keep your urine clear or pale yellow. ? Eat foods that are high in fiber, such as fresh fruits and vegetables, whole grains, and beans. Activity  Exercise only as directed by your health care provider. Most women can continue their usual exercise routine during pregnancy. Try to exercise for 30 minutes at least 5 days a week. Stop exercising if you experience uterine contractions.  Avoid heavy lifting, wear low heel shoes, and practice good posture.  A sexual relationship may be continued unless your health care provider directs you otherwise. Relieving pain and discomfort  Wear a good support bra to prevent discomfort from breast tenderness.  Take warm sitz baths to soothe any pain or discomfort caused by hemorrhoids. Use hemorrhoid  cream if your health care provider approves.  Rest with your legs elevated if you have leg cramps or low back pain.  If you develop varicose veins, wear support hose. Elevate your feet for 15 minutes, 3-4 times a day. Limit salt in your diet. Prenatal Care  Write down your questions. Take them to your prenatal visits.  Keep all your prenatal visits as told by your health care provider. This is important. Safety  Wear your seat belt at all times when driving.  Make a list of emergency phone numbers, including numbers for family, friends, the hospital, and police and fire departments. General instructions  Ask your health care provider for a referral to a local prenatal education class. Begin classes no later than the beginning of month 6 of your pregnancy.  Ask for help if you have counseling or nutritional needs during pregnancy. Your health care provider can offer advice or refer you to specialists for help with various needs.  Do not use hot  tubs, steam rooms, or saunas.  Do not douche or use tampons or scented sanitary pads.  Do not cross your legs for long periods of time.  Avoid cat litter boxes and soil used by cats. These carry germs that can cause birth defects in the baby and possibly loss of the fetus by miscarriage or stillbirth.  Avoid all smoking, herbs, alcohol, and unprescribed drugs. Chemicals in these products can affect the formation and growth of the baby.  Do not use any products that contain nicotine or tobacco, such as cigarettes and e-cigarettes. If you need help quitting, ask your health care provider.  Visit your dentist if you have not gone yet during your pregnancy. Use a soft toothbrush to brush your teeth and be gentle when you floss. Contact a health care provider if:  You have dizziness.  You have mild pelvic cramps, pelvic pressure, or nagging pain in the abdominal area.  You have persistent nausea, vomiting, or diarrhea.  You have a bad smelling vaginal discharge.  You have pain when you urinate. Get help right away if:  You have a fever.  You are leaking fluid from your vagina.  You have spotting or bleeding from your vagina.  You have severe abdominal cramping or pain.  You have rapid weight gain or weight loss.  You have shortness of breath with chest pain.  You notice sudden or extreme swelling of your face, hands, ankles, feet, or legs.  You have not felt your baby move in over an hour.  You have severe headaches that do not go away when you take medicine.  You have vision changes. Summary  The second trimester is from week 14 through week 27 (months 4 through 6). It is also a time when the fetus is growing rapidly.  Your body goes through many changes during pregnancy. The changes vary from woman to woman.  Avoid all smoking, herbs, alcohol, and unprescribed drugs. These chemicals affect the formation and growth your baby.  Do not use any tobacco products, such as  cigarettes, chewing tobacco, and e-cigarettes. If you need help quitting, ask your health care provider.  Contact your health care provider if you have any questions. Keep all prenatal visits as told by your health care provider. This is important. This information is not intended to replace advice given to you by your health care provider. Make sure you discuss any questions you have with your health care provider. Document Released: 06/18/2001 Document Revised: 11/30/2015 Document Reviewed:  08/25/2012 Elsevier Interactive Patient Education  2017 [employer]

## 2017-06-18 NOTE — Progress Notes (Signed)
INITIAL OBSTETRICAL VISIT Patient name: Sherry Greene MRN [id number]  Date of birth: 07/27/1985 Chief Complaint:   Initial Prenatal Visit  History of Present Illness:   Sherry Greene is a 31 y.o. 433P2002 African American female at 9911w3d by 10wk u/s, with an Estimated Date of Delivery: 11/16/17 being seen today for her initial obstetrical visit.   Her obstetrical history is significant for 1st baby born 2011: IOL @ 41.6wks d/t postdates, c/s for fetal distress after epidural per pt, states her bp's were elevated during this pregnancy- never on meds, never dx w/ pre-e. 2nd pregnancy 2013: RCS, no problems w/ BP.  Late care this pregnancy at 18wks, reports was b/c of insurance issues. Reports h/o seizures x ~5961yrs, has been seeing Dr. Gerilyn Pilgrimoonquah in [place], he stopped her Keppra about 1 year ago b/c he doesn't feel she is truly having seizures. Has referred her to neurologist in [place] for 2nd opinion, she hasn't scheduled appt yet. Reports last seizure-like episode was 1 month ago, has them app q 2-233mths. Only happens at night when she is sleeping and gets too hot. Husband wakes up and sees her shaking. Is not taking pnv, needs rx. Has been smoking THC occ to help w/ appetite, gets nauseated when she eats, and THC helps, plans to stop at 20wks. Is thinking about BTL.  BP elevated today, denies h/o CHTN, only reports BP elevated during 1st pregnancy as noted above.  Today she reports no complaints. Is feeling fetal movement. Denies cramping, leakage of fluid, vaginal bleeding.  Patient's last menstrual period was 02/17/2017 (approximate). Last pap ~7660yrs ago. Results were: normal Review of Systems:   Pertinent items are noted in HPI Denies cramping/contractions, leakage of fluid, vaginal bleeding, abnormal vaginal discharge w/ itching/odor/irritation, headaches, visual changes, shortness of breath, chest pain, abdominal pain, severe nausea/vomiting, or problems with urination or bowel movements unless  otherwise stated above.  Pertinent History Reviewed:  Reviewed past medical,surgical, social, obstetrical and family history.  Reviewed problem list, medications and allergies. OB History  Gravida Para Term Preterm AB Living  3 2 2  0 0 2  SAB TAB Ectopic Multiple Live Births  0 0 0 0 2    # Outcome Date GA Lbr Len/2nd Weight Sex Delivery Anes PTL Lv  3 Current           2 Term 10/21/11 316w0d  6 lb 6 oz (2.892 kg) M CS-LTranv Spinal N LIV  1 Term 01/03/10 54101w6d  6 lb 9 oz (2.977 kg) F CS-LTranv EPI N LIV     Complications: Fetal Intolerance     Physical Assessment:   Vitals:   06/18/17 1022  BP: (!) 120/92  Pulse: 82  Weight: 201 lb (91.2 kg)  Body mass index is 33.45 kg/m.       Physical Examination:  General appearance - well appearing, and in no distress  Mental status - alert, oriented to person, place, and time  Psych:  She has a normal mood and affect  Skin - warm and dry, normal color, no suspicious lesions noted  Chest - effort normal, all lung fields clear to auscultation bilaterally  Heart - normal rate and regular rhythm  Abdomen - soft, nontender  Extremities:  No swelling or varicosities noted  Pelvic - VULVA: normal appearing vulva with no masses, tenderness or lesions  VAGINA: normal appearing vagina with normal color and malodorous thin white d/c, no lesions  CERVIX: normal appearing cervix without discharge or lesions, no CMT  Thin prep  pap is done w/ reflex HR HPV cotesting  Fetal Heart Rate (bpm): 159 via doppler  Results for orders placed or performed in visit on 06/18/17 (from the past 24 hour(s))  POCT urinalysis dipstick   Collection Time: 06/18/17 10:43 AM  Result Value Ref Range   Color, UA     Clarity, UA     Glucose, UA neg    Bilirubin, UA     Ketones, UA neg    Spec Grav, UA  1.010 - 1.025   Blood, UA neg    pH, UA  5.0 - 8.0   Protein, UA neg    Urobilinogen, UA  0.2 or 1.0 E.U./dL   Nitrite, UA neg    Leukocytes, UA Negative  Negative   Appearance     Odor    POCT Wet Prep [facility] Drown([place]et [place])   Collection Time: 06/18/17 11:38 AM  Result Value Ref Range   Source Wet Prep POC vaginal    WBC, Wet Prep HPF POC few    Bacteria Wet Prep HPF POC None (A) Few   BACTERIA WET PREP MORPHOLOGY POC     Clue Cells Wet Prep HPF POC Many (A) None   Clue Cells Wet Prep Whiff POC Positive Whiff    Yeast Wet Prep HPF POC None    KOH Wet Prep POC     Trichomonas Wet Prep HPF POC Absent Absent    Assessment & Plan:  1) Low-Risk Pregnancy G3P2002 at 5965w3d with an Estimated Date of Delivery: 11/16/17   2) Initial OB visit  3) H/O ?seizures> off Keppra x 7482yr d/t Dr. Gerilyn Pilgrim[place]quah feeling they are not true seizures, has referred to neurologist in [place] for 2nd opinion, pt hasn't made appt yet. Pt to call asap to make appt.   4) Prev c/s x 2> discussed VBAC, gave consent to take home and review. Will get op notes from [facility]  5) THC use> advised cessation asap (don't wait until 20wks as was her plan), discussed potential long term effects to fetus.   6) Nausea> rx phenergan  7) ? H/O GHTN 1st pregnancy> start baby asa daily  8) Late care  9) BV> Rx metronidazole 500mg  BID x 7d for BV, no sex while taking   Initial labs obtained Rx prenatal vitamins w/ 11RF Reviewed n/v relief measures and warning s/s to report, Rx phenergan Reviewed recommended weight gain based on pre-gravid BMI Encouraged well-balanced diet Genetic Screening discussed Quad Screen: requested, ordered today Cystic fibrosis screening discussed declined Ultrasound discussed; fetal survey: requested CCNC completed> PCM not here today  Follow-up: Return for next available for anatomy u/s (no visit), then 4wks for LROB. sign release for c/s op notes please.   Orders Placed This Encounter  Procedures  . Urine Culture  . Flu Vaccine QUAD 36+ mos IM  . Flu Vaccine QUAD 36+ mos IM  . AFP TETRA  . Obstetric Panel, Including HIV  . Varicella zoster antibody, IgG  .  Urinalysis, Routine w reflex microscopic  . Sickle cell screen  . Pain Management Screening Profile (10S)  . POCT urinalysis dipstick  . POCT [employer]et Prep [facility]([place] [place])    Marge DuncansBooker, Tywana Robotham Randall CNM, [facility]NP-BC 06/18/2017 11:38 AM

## 2017-06-19 ENCOUNTER — Other Ambulatory Visit: Payer: Self-pay | Admitting: Women's Health

## 2017-06-19 ENCOUNTER — Telehealth: Payer: Self-pay | Admitting: *Deleted

## 2017-06-19 ENCOUNTER — Encounter: Payer: Self-pay | Admitting: Women's Health

## 2017-06-19 ENCOUNTER — Ambulatory Visit (INDEPENDENT_AMBULATORY_CARE_PROVIDER_SITE_OTHER): Payer: BLUE CROSS/BLUE SHIELD

## 2017-06-19 DIAGNOSIS — Z363 Encounter for antenatal screening for malformations: Secondary | ICD-10-CM

## 2017-06-19 DIAGNOSIS — Z8279 Family history of other congenital malformations, deformations and chromosomal abnormalities: Secondary | ICD-10-CM | POA: Insufficient documentation

## 2017-06-19 DIAGNOSIS — Z3402 Encounter for supervision of normal first pregnancy, second trimester: Secondary | ICD-10-CM

## 2017-06-19 LAB — CYTOLOGY - PAP
Chlamydia: NEGATIVE
Diagnosis: NEGATIVE
NEISSERIA GONORRHEA: NEGATIVE

## 2017-06-19 LAB — PMP SCREEN PROFILE (10S), URINE
AMPHETAMINE SCREEN URINE: NEGATIVE ng/mL
BARBITURATE SCREEN URINE: NEGATIVE ng/mL
BENZODIAZEPINE SCREEN, URINE: NEGATIVE ng/mL
CANNABINOIDS UR QL SCN: POSITIVE ng/mL — AB
COCAINE(METAB.)SCREEN, URINE: NEGATIVE ng/mL
Creatinine(Crt), U: 188.9 mg/dL (ref 20.0–300.0)
Methadone Screen, Urine: NEGATIVE ng/mL
OPIATE SCREEN URINE: NEGATIVE ng/mL
OXYCODONE+OXYMORPHONE UR QL SCN: NEGATIVE ng/mL
PH UR, DRUG SCRN: 6 (ref 4.5–8.9)
PHENCYCLIDINE QUANTITATIVE URINE: NEGATIVE ng/mL
Propoxyphene Scrn, Ur: NEGATIVE ng/mL

## 2017-06-19 MED ORDER — FERROUS SULFATE 325 (65 FE) MG PO TABS: 325.0000 mg | ORAL_TABLET | Freq: Two times a day (BID) | ORAL | 3 refills | Status: DC

## 2017-06-19 NOTE — Telephone Encounter (Signed)
Patient notified that she is anemic and iron sent to pharmacy buy [facility]. Advised to continue taking PNV and increase diet in iron rich foods such as green leafy veggies, beans, red meat, etc. Verbalized understanding.

## 2017-06-19 NOTE — Progress Notes (Signed)
Spoke w/ pt while she was here for anatomy u/s. [facility] No mention of GHTN/elevated bp's in note w/ first pregnancy, so do not start baby asa as previously discussed. Op note w/ 1st baby states reason for c/s was FTP, not NRFHR. 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  Today's anatomy u/s reveals bilateral pyelectasis- will repeat @ 32wks.  Sherry Greene, CNM, [facility]NP[facility]BC 06/19/2017 10:47 AM

## 2017-06-19 NOTE — Progress Notes (Signed)
US 18+4 wks,cephalic,cx 3.5 cm,post pl gr 0,normal ovaries bilat,svp of fluid 4 cm,fhr 148 bpm,bilat pyelectasis,left renal pelvis 4.8 mm,right 5.2 mm,EFW 252 g,anatomy complete

## 2017-06-20 LAB — URINE CULTURE

## 2017-06-21 LAB — AFP TETRA
DIA MOM VALUE: 1.69
DIA VALUE (EIA): 243.77 pg/mL
DSR (By Age)    1 IN: 741
DSR (SECOND TRIMESTER) 1 IN: 951
GESTATIONAL AGE AFP: 18.3 wk
MSAFP Mom: 1.01
MSAFP: 40.7 ng/mL
MSHCG Mom: 1.25
MSHCG: 27061 m[IU]/mL
Maternal Age At EDD: 29.5 yr
OSB RISK: 10000
T18 (By Age): 1:2885 {titer}
TEST RESULTS AFP: NEGATIVE
WEIGHT: 201 [lb_av]
uE3 Mom: 0.9
uE3 Value: 1.16 ng/mL

## 2017-06-21 LAB — URINALYSIS, ROUTINE W REFLEX MICROSCOPIC
BILIRUBIN UA: NEGATIVE
GLUCOSE, UA: NEGATIVE
KETONES UA: NEGATIVE
Leukocytes, UA: NEGATIVE
NITRITE UA: NEGATIVE
PROTEIN UA: NEGATIVE
RBC UA: NEGATIVE
Specific Gravity, UA: 1.026 (ref 1.005–1.030)
UUROB: 0.2 mg/dL (ref 0.2–1.0)
pH, UA: 6 (ref 5.0–7.5)

## 2017-06-21 LAB — OBSTETRIC PANEL, INCLUDING HIV
ANTIBODY SCREEN: NEGATIVE
BASOS: 0 %
Basophils Absolute: 0 10*3/uL (ref 0.0–0.2)
EOS (ABSOLUTE): 0.2 10*3/uL (ref 0.0–0.4)
EOS: 1 %
HEMATOCRIT: 31.1 % — AB (ref 34.0–46.6)
HIV Screen 4th Generation wRfx: NONREACTIVE
Hemoglobin: 10.1 g/dL — ABNORMAL LOW (ref 11.1–15.9)
Hepatitis B Surface Ag: NEGATIVE
IMMATURE GRANS (ABS): 0 10*3/uL (ref 0.0–0.1)
IMMATURE GRANULOCYTES: 0 %
LYMPHS: 17 %
Lymphocytes Absolute: 2.4 10*3/uL (ref 0.7–3.1)
MCH: 28.4 pg (ref 26.6–33.0)
MCHC: 32.5 g/dL (ref 31.5–35.7)
MCV: 87 fL (ref 79–97)
MONOCYTES: 7 %
MONOS ABS: 1 10*3/uL — AB (ref 0.1–0.9)
NEUTROS PCT: 75 %
Neutrophils Absolute: 10.2 10*3/uL — ABNORMAL HIGH (ref 1.4–7.0)
Platelets: 282 10*3/uL (ref 150–379)
RBC: 3.56 x10E6/uL — AB (ref 3.77–5.28)
RDW: 15.5 % — ABNORMAL HIGH (ref 12.3–15.4)
RPR Ser Ql: NONREACTIVE
RUBELLA: 3.24 {index} (ref >0.99)
Rh Factor: POSITIVE
WBC: 13.7 10*3/uL — ABNORMAL HIGH (ref 3.4–10.8)

## 2017-06-21 LAB — SICKLE CELL SCREEN: SICKLE CELL SCREEN: NEGATIVE

## 2017-06-21 LAB — VARICELLA ZOSTER ANTIBODY, IGG: VARICELLA: 716 {index} (ref >165)

## 2017-06-24 ENCOUNTER — Telehealth: Payer: Self-pay | Admitting: *Deleted

## 2017-06-24 NOTE — Telephone Encounter (Signed)
LMOVM to return call.

## 2017-06-25 NOTE — Telephone Encounter (Signed)
LMOVM for patient to return call.

## 2017-07-16 ENCOUNTER — Encounter (INDEPENDENT_AMBULATORY_CARE_PROVIDER_SITE_OTHER): Payer: Self-pay

## 2017-07-16 ENCOUNTER — Encounter: Payer: BLUE CROSS/BLUE SHIELD | Admitting: Women's Health

## 2017-07-16 ENCOUNTER — Ambulatory Visit (INDEPENDENT_AMBULATORY_CARE_PROVIDER_SITE_OTHER): Payer: BLUE CROSS/BLUE SHIELD | Admitting: Obstetrics and Gynecology

## 2017-07-16 VITALS — BP 118/68 | HR 84 | Wt 202.0 lb

## 2017-07-16 DIAGNOSIS — Z3482 Encounter for supervision of other normal pregnancy, second trimester: Secondary | ICD-10-CM

## 2017-07-16 DIAGNOSIS — Z1389 Encounter for screening for other disorder: Secondary | ICD-10-CM

## 2017-07-16 DIAGNOSIS — Z3A22 22 weeks gestation of pregnancy: Secondary | ICD-10-CM

## 2017-07-16 DIAGNOSIS — O99352 Diseases of the nervous system complicating pregnancy, second trimester: Secondary | ICD-10-CM

## 2017-07-16 DIAGNOSIS — Z331 Pregnant state, incidental: Secondary | ICD-10-CM

## 2017-07-16 DIAGNOSIS — O09292 Supervision of pregnancy with other poor reproductive or obstetric history, second trimester: Secondary | ICD-10-CM

## 2017-07-16 DIAGNOSIS — O34219 Maternal care for unspecified type scar from previous cesarean delivery: Secondary | ICD-10-CM

## 2017-07-16 DIAGNOSIS — G40909 Epilepsy, unspecified, not intractable, without status epilepticus: Secondary | ICD-10-CM

## 2017-07-16 NOTE — Progress Notes (Signed)
Patient ID: Sherry Greene, female   DOB: 09/04/1985, 32 y.o.   MRN: [id number]   LOW-RISK PREGNANCY VISIT Patient name: Sherry Greene MRN [id number]  Date of birth: 07/16/1985 Chief Complaint:   Routine Prenatal Visit  History of Present Illness:   Sherry Greene is a 32 y.o. 543P2002 female at 6046w3d with an Estimated Date of Delivery: 11/16/17 being seen today for ongoing management of a low-risk pregnancy.  Today she reports recently having a seizure. She states that she will have them during the night while she is sleeping. Her husband witnesses them and states she will vomit because of the seizures. The patient has had two c-sections prior to this pregnancy. The first one was an emergency due to the baby's heart rate dropping. The second was because she already had one prior. [place]. First child was 1 month past her due date and was 6 pounds 9 ounces. She is wondering if she is able to deliver vaginally this time around. The patient reports that after this pregnancy she would like to get her tubes tied. Contractions: Not present. Vag. Bleeding: None.  Movement: Present. denies leaking of fluid. Review of Systems:   Pertinent items are noted in HPI Denies abnormal vaginal discharge w/ itching/odor/irritation, headaches, visual changes, shortness of breath, chest pain, abdominal pain, severe nausea/vomiting, or problems with urination or bowel movements unless otherwise stated above. Pertinent History Reviewed:  Reviewed past medical,surgical, social, obstetrical and family history.  Reviewed problem list, medications and allergies. Physical Assessment:   Vitals:   07/16/17 1059  BP: 118/68  Pulse: 84  Weight: 202 lb (91.6 kg)  Body mass index is 33.61 kg/m.        Physical Examination:   General appearance: Well appearing, and in no distress  Mental status: Alert, oriented to person, place, and time  Skin: Warm & dry  Cardiovascular: Normal heart rate  noted  Respiratory: Normal respiratory effort, no distress  Abdomen: Soft, gravid, nontender  Pelvic: Cervical exam deferred         Extremities: Edema: None  Fetal Status: Fetal Heart Rate (bpm): 147 Fundal Height: 26 cm Movement: Present    No results found for this or any previous visit (from the past 24 hour(s)).  Assessment & Plan:  1) Low-risk pregnancy G3P2002 at 5546w3d with an Estimated Date of Delivery: 11/16/17   2)Seizure disorder: nocturnal "seizures", recurrent. Will increase Keppra HS to 1000 mg. Keep am dose at 500 mg  3.  Prior cesarean section x2, considering TOLAC but also desiring permanent sterilization Meds: No orders of the defined types were placed in this encounter.  Labs/procedures today:  Plan:   1. Continue routine obstetrical care  2. Increase Keppra, 2 tablets once a day hs 3. Review c-section records to check eligibility for vaginal delivery.  Given that the patient is having a tubal sterilization I would be inclined to lean toward repeat cesarean section and tubal ligation.  Rationale for this opinion explained to patient, though the option of reviewing the records and seriously considering offering TOLAC will be done at the follow-up visit in 4 weeks  Reviewed: Preterm labor symptoms and general obstetric precautions including but not limited to vaginal bleeding, contractions, leaking of fluid and fetal movement were reviewed in detail with the patient.  All questions were answered  Follow-up: Return in about 4 weeks (around 08/13/2017) for LROB with Dr. Emelda FearFerguson, PN-2 Labs.  Orders Placed This Encounter  Procedures  . POCT Urinalysis Dipstick  By signing my name below, I, Diona Browner, attest that this documentation has been prepared under the direction and in the presence of Tilda Burrow, MD. Electronically Signed: Diona Browner, Medical Scribe. 07/16/17. 11:27 AM.  I personally performed the services described in this documentation, which  was SCRIBED in my presence. The recorded information has been reviewed and considered accurate. It has been edited as necessary during review. Tilda Burrow, MD

## 2017-07-25 NOTE — Addendum Note (Signed)
Addended by: Moss McRESENZO, Alonzo Owczarzak M on: 07/25/2017 10:32 AM   Modules accepted: Orders

## 2017-08-13 ENCOUNTER — Encounter: Payer: Self-pay | Admitting: *Deleted

## 2017-08-13 ENCOUNTER — Ambulatory Visit (INDEPENDENT_AMBULATORY_CARE_PROVIDER_SITE_OTHER): Payer: BLUE CROSS/BLUE SHIELD | Admitting: Advanced Practice Midwife

## 2017-08-13 ENCOUNTER — Other Ambulatory Visit: Payer: Self-pay

## 2017-08-13 ENCOUNTER — Other Ambulatory Visit: Payer: BLUE CROSS/BLUE SHIELD

## 2017-08-13 VITALS — BP 136/82 | HR 81 | Wt 202.0 lb

## 2017-08-13 DIAGNOSIS — Z87898 Personal history of other specified conditions: Secondary | ICD-10-CM

## 2017-08-13 DIAGNOSIS — Z3A26 26 weeks gestation of pregnancy: Secondary | ICD-10-CM

## 2017-08-13 DIAGNOSIS — Z131 Encounter for screening for diabetes mellitus: Secondary | ICD-10-CM

## 2017-08-13 DIAGNOSIS — Z3482 Encounter for supervision of other normal pregnancy, second trimester: Secondary | ICD-10-CM

## 2017-08-13 DIAGNOSIS — Z331 Pregnant state, incidental: Secondary | ICD-10-CM

## 2017-08-13 DIAGNOSIS — G40909 Epilepsy, unspecified, not intractable, without status epilepticus: Secondary | ICD-10-CM

## 2017-08-13 DIAGNOSIS — O99352 Diseases of the nervous system complicating pregnancy, second trimester: Secondary | ICD-10-CM

## 2017-08-13 DIAGNOSIS — Z1389 Encounter for screening for other disorder: Secondary | ICD-10-CM

## 2017-08-13 NOTE — Patient Instructions (Addendum)
Sherry Greene, I greatly value your feedback.  If you receive a survey following your visit with [place] today, we appreciate you taking the time to fill it out.  Thanks, Cathie Beams, CNM   Call the office [phone]) or go to [facility] if:  You begin to have strong, frequent contractions  Your water breaks.  Sometimes it is a big gush of fluid, sometimes it is just a trickle that keeps getting your panties wet or running down your legs  You have vaginal bleeding.  It is normal to have a small amount of spotting if your cervix was checked.   You don't feel your baby moving like normal.  If you don't, get you something to eat and drink and lay down and focus on feeling your baby move.  You should feel at least 10 movements in 2 hours.  If you don't, you should call the office or go to [facility].    Tdap Vaccine  It is recommended that you get the Tdap vaccine during the third trimester of EACH pregnancy to help protect your baby from getting pertussis (whooping cough)  27-36 weeks is the BEST time to do this so that you can pass the protection on to your baby. During pregnancy is better than after pregnancy, but if you are unable to get it during pregnancy it will be offered at the hospital.   You can get this vaccine at the health department or your family doctor  Everyone who will be around your baby should also be up-to-date on their vaccines. Adults (who are not pregnant) only need 1 dose of Tdap during adulthood.   Third Trimester of Pregnancy The third trimester is from week 29 through week 42, months 7 through 9. The third trimester is a time when the fetus is growing rapidly. At the end of the ninth month, the fetus is about 20 inches in length and weighs 6-10 pounds.  BODY CHANGES Your body goes through many changes during pregnancy. The changes vary from woman to woman.   Your weight will continue to increase. You can expect to gain 25-35 pounds (11-16 kg)  by the end of the pregnancy.  You may begin to get stretch marks on your hips, abdomen, and breasts.  You may urinate more often because the fetus is moving lower into your pelvis and pressing on your bladder.  You may develop or continue to have heartburn as a result of your pregnancy.  You may develop constipation because certain hormones are causing the muscles that push waste through your intestines to slow down.  You may develop hemorrhoids or swollen, bulging veins (varicose veins).  You may have pelvic pain because of the weight gain and pregnancy hormones relaxing your joints between the bones in your pelvis. Backaches may result from overexertion of the muscles supporting your posture.  You may have changes in your hair. These can include thickening of your hair, rapid growth, and changes in texture. Some women also have hair loss during or after pregnancy, or hair that feels dry or thin. Your hair will most likely return to normal after your baby is born.  Your breasts will continue to grow and be tender. A yellow discharge may leak from your breasts called colostrum.  Your belly button may stick out.  You may feel short of breath because of your expanding uterus.  You may notice the fetus "dropping," or moving lower in your abdomen.  You may have a bloody mucus discharge.  This usually occurs a few days to a week before labor begins.  Your cervix becomes thin and soft (effaced) near your due date. WHAT TO EXPECT AT YOUR PRENATAL EXAMS  You will have prenatal exams every 2 weeks until week 36. Then, you will have weekly prenatal exams. During a routine prenatal visit:  You will be weighed to make sure you and the fetus are growing normally.  Your blood pressure is taken.  Your abdomen will be measured to track your baby's growth.  The fetal heartbeat will be listened to.  Any test results from the previous visit will be discussed.  You may have a cervical check near  your due date to see if you have effaced. At around 36 weeks, your caregiver will check your cervix. At the same time, your caregiver will also perform a test on the secretions of the vaginal tissue. This test is to determine if a type of bacteria, Group B streptococcus, is present. Your caregiver will explain this further. Your caregiver may ask you:  What your birth plan is.  How you are feeling.  If you are feeling the baby move.  If you have had any abnormal symptoms, such as leaking fluid, bleeding, severe headaches, or abdominal cramping.  If you have any questions. Other tests or screenings that may be performed during your third trimester include:  Blood tests that check for low iron levels (anemia).  Fetal testing to check the health, activity level, and growth of the fetus. Testing is done if you have certain medical conditions or if there are problems during the pregnancy. FALSE LABOR You may feel small, irregular contractions that eventually go away. These are called Braxton Hicks contractions, or false labor. Contractions may last for hours, days, or even weeks before true labor sets in. If contractions come at regular intervals, intensify, or become painful, it is best to be seen by your caregiver.  SIGNS OF LABOR   Menstrual-like cramps.  Contractions that are 5 minutes apart or less.  Contractions that start on the top of the uterus and spread down to the lower abdomen and back.  A sense of increased pelvic pressure or back pain.  A watery or bloody mucus discharge that comes from the vagina. If you have any of these signs before the 37th week of pregnancy, call your caregiver right away. You need to go to the hospital to get checked immediately. HOME CARE INSTRUCTIONS   Avoid all smoking, herbs, alcohol, and unprescribed drugs. These chemicals affect the formation and growth of the baby.  Follow your caregiver's instructions regarding medicine use. There are  medicines that are either safe or unsafe to take during pregnancy.  Exercise only as directed by your caregiver. Experiencing uterine cramps is a good sign to stop exercising.  Continue to eat regular, healthy meals.  Wear a good support bra for breast tenderness.  Do not use hot tubs, steam rooms, or saunas.  Wear your seat belt at all times when driving.  Avoid raw meat, uncooked cheese, cat litter boxes, and soil used by cats. These carry germs that can cause birth defects in the baby.  Take your prenatal vitamins.  Try taking a stool softener (if your caregiver approves) if you develop constipation. Eat more high-fiber foods, such as fresh vegetables or fruit and whole grains. Drink plenty of fluids to keep your urine clear or pale yellow.  Take warm sitz baths to soothe any pain or discomfort caused by hemorrhoids. Use hemorrhoid  cream if your caregiver approves.  If you develop varicose veins, wear support hose. Elevate your feet for 15 minutes, 3-4 times a day. Limit salt in your diet.  Avoid heavy lifting, wear low heal shoes, and practice good posture.  Rest a lot with your legs elevated if you have leg cramps or low back pain.  Visit your dentist if you have not gone during your pregnancy. Use a soft toothbrush to brush your teeth and be gentle when you floss.  A sexual relationship may be continued unless your caregiver directs you otherwise.  Do not travel far distances unless it is absolutely necessary and only with the approval of your caregiver.  Take prenatal classes to understand, practice, and ask questions about the labor and delivery.  Make a trial run to the hospital.  Pack your hospital bag.  Prepare the baby's nursery.  Continue to go to all your prenatal visits as directed by your caregiver. SEEK MEDICAL CARE IF:  You are unsure if you are in labor or if your water has broken.  You have dizziness.  You have mild pelvic cramps, pelvic pressure, or  nagging pain in your abdominal area.  You have persistent nausea, vomiting, or diarrhea.  You have a bad smelling vaginal discharge.  You have pain with urination. SEEK IMMEDIATE MEDICAL CARE IF:   You have a fever.  You are leaking fluid from your vagina.  You have spotting or bleeding from your vagina.  You have severe abdominal cramping or pain.  You have rapid weight loss or gain.  You have shortness of breath with chest pain.  You notice sudden or extreme swelling of your face, hands, ankles, feet, or legs.  You have not felt your baby move in over an hour.  You have severe headaches that do not go away with medicine.  You have vision changes. Document Released: 06/18/2001 Document Revised: 06/29/2013 Document Reviewed: 08/25/2012 [facility] Patient Information 2015 [employer], [place]. This information is not intended to replace advice given to you by your health care provider. Make sure you discuss any questions you have with your health care provider.   Uterine rupture with a trial of labor after a cesarean section.

## 2017-08-13 NOTE — Progress Notes (Signed)
[id number] 1143w3d Estimated Date of Delivery: 11/16/17  Blood pressure 136/82, pulse 81, weight 202 lb (91.6 kg), last menstrual period 02/17/2017, unknown if currently breastfeeding.   BP weight and urine results all reviewed and noted.  Please refer to the obstetrical flow sheet for the fundal height and fetal heart rate documentation:  Patient reports good fetal movement, denies any bleeding and no rupture of membranes symptoms or regular contractions. Patient is without complaints.  Discussed "seizures":  Note from Dec says she was off Keppra for the past year, bc Dr .Gerilyn Pilgrimoonquah didn't feel she was actually having seizures; Note from Jan says JVF increased her dosage dt having "more seizures at night". Pt states that he misunderstood that she was taking Keppra.  Still isn't on it. Pt states she has seizures when she gets too hot  At night. . 2 months ago,  Pt was instructed to get 2nd opinion neuro appt asap.. Didn't do it.  Neuro consult ordered and they will call her for an appt .    DIscussed TOLAC vs RLTCS/BTL. Pt unaware of what a uterine rupture could mean for her and her baby. Wants to discuss it further now w/her husband. Also considering [employer] d/t husband not wanting her to get BTL. .   All questions were answered.  Orders Placed This Encounter  Procedures  . POCT urinalysis dipstick    Plan:  Continued routine obstetrical care,   Return in about 4 weeks (around 09/10/2017) for LROB.

## 2017-08-14 ENCOUNTER — Telehealth: Payer: Self-pay | Admitting: *Deleted

## 2017-08-14 LAB — CBC
Hematocrit: 31.7 % — ABNORMAL LOW (ref 34.0–46.6)
Hemoglobin: 10.2 g/dL — ABNORMAL LOW (ref 11.1–15.9)
MCH: 29.3 pg (ref 26.6–33.0)
MCHC: 32.2 g/dL (ref 31.5–35.7)
MCV: 91 fL (ref 79–97)
PLATELETS: 268 10*3/uL (ref 150–379)
RBC: 3.48 x10E6/uL — ABNORMAL LOW (ref 3.77–5.28)
RDW: 14.3 % (ref 12.3–15.4)
WBC: 13 10*3/uL — AB (ref 3.4–10.8)

## 2017-08-14 LAB — GLUCOSE TOLERANCE, 2 HOURS W/ 1HR
Glucose, 1 hour: 70 mg/dL (ref 65–179)
Glucose, 2 hour: 88 mg/dL (ref 65–152)
Glucose, Fasting: 75 mg/dL (ref 65–91)

## 2017-08-14 LAB — ANTIBODY SCREEN: Antibody Screen: NEGATIVE

## 2017-08-14 LAB — HIV ANTIBODY (ROUTINE TESTING W REFLEX): HIV Screen 4th Generation wRfx: NONREACTIVE

## 2017-08-14 LAB — RPR: RPR Ser Ql: NONREACTIVE

## 2017-08-14 NOTE — Telephone Encounter (Signed)
Hemoglobin 9.4. Pt started taking iron supplements yesterday.

## 2017-08-20 NOTE — Addendum Note (Signed)
Addended by: Moss McRESENZO, Clemente Dewey M on: 08/20/2017 02:02 PM   Modules accepted: Orders

## 2017-09-10 ENCOUNTER — Encounter: Payer: BLUE CROSS/BLUE SHIELD | Admitting: Advanced Practice Midwife

## 2017-09-10 ENCOUNTER — Telehealth: Payer: Self-pay | Admitting: *Deleted

## 2017-09-10 NOTE — Telephone Encounter (Signed)
Patient called this morning in regards to rescheduling her appointment d/t Drenda FreezeFran being out sick. Patient stated she needed to be seen since she was 32 weeks and needed her U/S because last visit she was supposed to have her U/S but had to have her sugar test. States she has had to reschedule in the past d/t Drenda FreezeFran being out sick and wanted to know when "she was going to see her baby". I attempted several times to discuss plan of care during pregnancy with the patient as we only see patient every 4 weeks until 32 weeks, however she would talk over me and not let me get a word in or finish my sentence. I politely asked the patient twice to please let me discuss this with her. I was finally able to inform the patient that she was not 32 weeks in which she told me she was and was not 30 weeks. I explained to her that she did not have her first u/s until she was 18 weeks which was in her second trimester and was given her due date then and was not based on her period. Patient continued to tell me she was 32 weeks and needed her U/S and a visit. I offered for patient to be seen tomorrow but patient stated she was busy and had things to do. I offered her Friday as well but she stated she couldn't come and would not be coming back. Patient then hung up the phone.

## 2017-10-09 ENCOUNTER — Ambulatory Visit (INDEPENDENT_AMBULATORY_CARE_PROVIDER_SITE_OTHER): Payer: BLUE CROSS/BLUE SHIELD | Admitting: Advanced Practice Midwife

## 2017-10-09 ENCOUNTER — Encounter: Payer: Self-pay | Admitting: Advanced Practice Midwife

## 2017-10-09 VITALS — BP 118/82 | HR 94 | Wt 203.0 lb

## 2017-10-09 DIAGNOSIS — Z1389 Encounter for screening for other disorder: Secondary | ICD-10-CM

## 2017-10-09 DIAGNOSIS — Z331 Pregnant state, incidental: Secondary | ICD-10-CM

## 2017-10-09 DIAGNOSIS — Z3A34 34 weeks gestation of pregnancy: Secondary | ICD-10-CM

## 2017-10-09 DIAGNOSIS — O358XX0 Maternal care for other (suspected) fetal abnormality and damage, not applicable or unspecified: Secondary | ICD-10-CM

## 2017-10-09 DIAGNOSIS — Z3483 Encounter for supervision of other normal pregnancy, third trimester: Secondary | ICD-10-CM

## 2017-10-09 DIAGNOSIS — O35EXX0 Maternal care for other (suspected) fetal abnormality and damage, fetal genitourinary anomalies, not applicable or unspecified: Secondary | ICD-10-CM

## 2017-10-09 LAB — POCT URINALYSIS DIPSTICK
GLUCOSE UA: NEGATIVE
KETONES UA: NEGATIVE
Leukocytes, UA: NEGATIVE
Nitrite, UA: NEGATIVE

## 2017-10-09 NOTE — Progress Notes (Signed)
  [id number] 5071w4d Estimated Date of Delivery: 11/16/17  Blood pressure 118/82, pulse 94, weight 203 lb (92.1 kg), last menstrual period 02/17/2017, unknown if currently breastfeeding.   BP weight and urine results all reviewed and noted.  Please refer to the obstetrical flow sheet for the fundal height and fetal heart rate documentation:  Patient reports good fetal movement, denies any bleeding and no rupture of membranes symptoms or regular contractions. Patient is without complaints.  She hasn't been seen since early Feb states she has "been busy with the kids and such".  Has neuro appt 4/11 at 2pm, wrote it down in her phone.  All questions were answered.   Physical Assessment:   Vitals:   10/09/17 0951  BP: 118/82  Pulse: 94  Weight: 203 lb (92.1 kg)  Body mass index is 33.78 kg/m.        Physical Examination:   General appearance: Well appearing, and in no distress  Mental status: Alert, oriented to person, place, and time  Skin: Warm & dry  Cardiovascular: Normal heart rate noted  Respiratory: Normal respiratory effort, no distress  Abdomen: Soft, gravid, nontender  Pelvic: Cervical exam deferred         Extremities: Edema: Trace  Fetal Status: Fetal Heart Rate (bpm): 149 Fundal Height: 37 cm Movement: Present    Results for orders placed or performed in visit on 10/09/17 (from the past 24 hour(s))  POCT urinalysis dipstick   Collection Time: 10/09/17  9:56 AM  Result Value Ref Range   Color, UA     Clarity, UA     Glucose, UA neg    Bilirubin, UA     Ketones, UA neg    Spec Grav, UA  1.010 - 1.025   Blood, UA trace    pH, UA  5.0 - 8.0   Protein, UA trace    Urobilinogen, UA  0.2 or 1.0 E.U./dL   Nitrite, UA neg    Leukocytes, UA Negative Negative   Appearance     Odor       Orders Placed This Encounter  Procedures  . US OB Follow Up  . POCT urinalysis dipstick    Plan:  Continued routine obstetrical care, keep neuro appt to see if she needs to be on  Keppra; Unsure about BTL, will sign today just in case  Return in about 2 weeks (around 10/23/2017) for sign BTL form, LROB, US:OB F/U:.

## 2017-10-09 NOTE — Patient Instructions (Signed)

## 2017-10-15 ENCOUNTER — Telehealth: Payer: Self-pay | Admitting: *Deleted

## 2017-10-15 NOTE — Telephone Encounter (Signed)
Attempted to call patient. No answer phone just rings.

## 2017-10-16 ENCOUNTER — Telehealth: Payer: Self-pay | Admitting: *Deleted

## 2017-10-16 ENCOUNTER — Ambulatory Visit: Payer: BLUE CROSS/BLUE SHIELD | Admitting: Neurology

## 2017-10-16 NOTE — Telephone Encounter (Signed)
No showed new patient appointment. 

## 2017-10-17 ENCOUNTER — Encounter: Payer: Self-pay | Admitting: Neurology

## 2017-10-23 ENCOUNTER — Encounter: Payer: Self-pay | Admitting: Obstetrics and Gynecology

## 2017-10-23 ENCOUNTER — Ambulatory Visit (INDEPENDENT_AMBULATORY_CARE_PROVIDER_SITE_OTHER): Payer: BLUE CROSS/BLUE SHIELD

## 2017-10-23 ENCOUNTER — Ambulatory Visit (INDEPENDENT_AMBULATORY_CARE_PROVIDER_SITE_OTHER): Payer: BLUE CROSS/BLUE SHIELD | Admitting: Women's Health

## 2017-10-23 ENCOUNTER — Encounter: Payer: Self-pay | Admitting: Women's Health

## 2017-10-23 VITALS — BP 132/74 | HR 96 | Wt 197.8 lb

## 2017-10-23 DIAGNOSIS — G40909 Epilepsy, unspecified, not intractable, without status epilepticus: Secondary | ICD-10-CM | POA: Diagnosis not present

## 2017-10-23 DIAGNOSIS — O99353 Diseases of the nervous system complicating pregnancy, third trimester: Secondary | ICD-10-CM

## 2017-10-23 DIAGNOSIS — Z3483 Encounter for supervision of other normal pregnancy, third trimester: Secondary | ICD-10-CM

## 2017-10-23 DIAGNOSIS — Z98891 History of uterine scar from previous surgery: Secondary | ICD-10-CM

## 2017-10-23 DIAGNOSIS — Z3A36 36 weeks gestation of pregnancy: Secondary | ICD-10-CM

## 2017-10-23 DIAGNOSIS — Q620 Congenital hydronephrosis: Secondary | ICD-10-CM | POA: Diagnosis not present

## 2017-10-23 DIAGNOSIS — O35EXX0 Maternal care for other (suspected) fetal abnormality and damage, fetal genitourinary anomalies, not applicable or unspecified: Secondary | ICD-10-CM

## 2017-10-23 DIAGNOSIS — O358XX0 Maternal care for other (suspected) fetal abnormality and damage, not applicable or unspecified: Secondary | ICD-10-CM

## 2017-10-23 DIAGNOSIS — Z1389 Encounter for screening for other disorder: Secondary | ICD-10-CM

## 2017-10-23 DIAGNOSIS — Z3403 Encounter for supervision of normal first pregnancy, third trimester: Secondary | ICD-10-CM

## 2017-10-23 DIAGNOSIS — O34219 Maternal care for unspecified type scar from previous cesarean delivery: Secondary | ICD-10-CM

## 2017-10-23 DIAGNOSIS — Z331 Pregnant state, incidental: Secondary | ICD-10-CM

## 2017-10-23 LAB — POCT URINALYSIS DIPSTICK
Blood, UA: NEGATIVE
GLUCOSE UA: NEGATIVE
KETONES UA: NEGATIVE
Leukocytes, UA: NEGATIVE
Nitrite, UA: NEGATIVE

## 2017-10-23 NOTE — Progress Notes (Signed)
US 36+4 wks,cephalic,posterior pl gr 3,normal ovaries bilat,normal kidneys,fhr 150 bpm,afi 11 cm,EFW 2581 g 18%

## 2017-10-23 NOTE — Patient Instructions (Signed)
GrenadaBrittany Yearby, I greatly value your feedback.  If you receive a survey following your visit with us today, we appreciate you taking the time to fill it out.  Thanks, Joellyn HaffKim Mumin Denomme, CNM, WHNP-BC   Call the office [phone]) or go to [facility] if:  You begin to have strong, frequent contractions  Your water breaks.  Sometimes it is a big gush of fluid, sometimes it is just a trickle that keeps getting your panties wet or running down your legs  You have vaginal bleeding.  It is normal to have a small amount of spotting if your cervix was checked.   You don't feel your baby moving like normal.  If you don't, get you something to eat and drink and lay down and focus on feeling your baby move.  You should feel at least 10 movements in 2 hours.  If you don't, you should call the office or go to [facility].     [facility] Hicks Contractions Contractions of the uterus can occur throughout pregnancy, but they are not always a sign that you are in labor. You may have practice contractions called Braxton Hicks contractions. These false labor contractions are sometimes confused with true labor. What are Deberah PeltonBraxton Hicks contractions? Braxton Hicks contractions are tightening movements that occur in the muscles of the uterus before labor. Unlike true labor contractions, these contractions do not result in opening (dilation) and thinning of the cervix. Toward the end of pregnancy (32-34 weeks), Braxton Hicks contractions can happen more often and may become stronger. These contractions are sometimes difficult to tell apart from true labor because they can be very uncomfortable. You should not feel embarrassed if you go to the hospital with false labor. Sometimes, the only way to tell if you are in true labor is for your health care provider to look for changes in the cervix. The health care provider will do a physical exam and may monitor your contractions. If you are not in true labor, the exam should  show that your cervix is not dilating and your water has not broken. If there are other health problems associated with your pregnancy, it is completely safe for you to be sent home with false labor. You may continue to have Braxton Hicks contractions until you go into true labor. How to tell the difference between true labor and false labor True labor  Contractions last 30-70 seconds.  Contractions become very regular.  Discomfort is usually felt in the top of the uterus, and it spreads to the lower abdomen and low back.  Contractions do not go away with walking.  Contractions usually become more intense and increase in frequency.  The cervix dilates and gets thinner. False labor  Contractions are usually shorter and not as strong as true labor contractions.  Contractions are usually irregular.  Contractions are often felt in the front of the lower abdomen and in the groin.  Contractions may go away when you walk around or change positions while lying down.  Contractions get weaker and are shorter-lasting as time goes on.  The cervix usually does not dilate or become thin. Follow these instructions at home:  Take over-the-counter and prescription medicines only as told by your health care provider.  Keep up with your usual exercises and follow other instructions from your health care provider.  Eat and drink lightly if you think you are going into labor.  If Braxton Hicks contractions are making you uncomfortable: ? Change your position from lying down  or resting to walking, or change from walking to resting. ? Sit and rest in a tub of warm water. ? Drink enough fluid to keep your urine pale yellow. Dehydration may cause these contractions. ? Do slow and deep breathing several times an hour.  Keep all follow-up prenatal visits as told by your health care provider. This is important. Contact a health care provider if:  You have a fever.  You have continuous pain in  your abdomen. Get help right away if:  Your contractions become stronger, more regular, and closer together.  You have fluid leaking or gushing from your vagina.  You pass blood-tinged mucus (bloody show).  You have bleeding from your vagina.  You have low back pain that you never had before.  You feel your baby's head pushing down and causing pelvic pressure.  Your baby is not moving inside you as much as it used to. Summary  Contractions that occur before labor are called Braxton Hicks contractions, false labor, or practice contractions.  Braxton Hicks contractions are usually shorter, weaker, farther apart, and less regular than true labor contractions. True labor contractions usually become progressively stronger and regular and they become more frequent.  Manage discomfort from [facility] contractions by changing position, resting in a warm bath, drinking plenty of water, or practicing deep breathing. This information is not intended to replace advice given to you by your health care provider. Make sure you discuss any questions you have with your health care provider. Document Released: 11/07/2016 Document Revised: 11/07/2016 Document Reviewed: 11/07/2016 Elsevier Interactive Patient Education  2018 [employer].

## 2017-10-23 NOTE — Progress Notes (Signed)
   LOW-RISK PREGNANCY VISIT Patient name: Sherry Greene MRN [id number]  Date of birth: 01/05/1986 Chief Complaint:   Routine Prenatal Visit (f/u us today)  History of Present Illness:   Sherry Greene is a 32 y.o. 423P2002 female at 7120w4d with an Estimated Date of Delivery: 11/16/17 being seen today for ongoing management of a low-risk pregnancy.  Today she reports no complaints. Contractions: Irregular. Vag. Bleeding: None.  Movement: Present. denies leaking of fluid. Review of Systems:   Pertinent items are noted in HPI Denies abnormal vaginal discharge w/ itching/odor/irritation, headaches, visual changes, shortness of breath, chest pain, abdominal pain, severe nausea/vomiting, or problems with urination or bowel movements unless otherwise stated above. Pertinent History Reviewed:  Reviewed past medical,surgical, social, obstetrical and family history.  Reviewed problem list, medications and allergies. Physical Assessment:   Vitals:   10/23/17 1137  BP: 132/74  Pulse: 96  Weight: 197 lb 12.8 oz (89.7 kg)  Body mass index is 32.92 kg/m.        Physical Examination:   General appearance: Well appearing, and in no distress  Mental status: Alert, oriented to person, place, and time  Skin: Warm & dry  Cardiovascular: Normal heart rate noted  Respiratory: Normal respiratory effort, no distress  Abdomen: Soft, gravid, nontender  Pelvic: Cervical exam performed  Dilation: Fingertip Effacement (%): Thick Station: -2  Extremities: Edema: Trace  Fetal Status: Fetal Heart Rate (bpm): +u/s Fundal Height: 37 cm Movement: Present Presentation: Vertex US 36+4 wks,cephalic,posterior pl gr 3,normal ovaries bilat,normal kidneys,fhr 150 bpm,afi 11 cm,EFW 2581 g 18%  Results for orders placed or performed in visit on 10/23/17 (from the past 24 hour(s))  POCT Urinalysis Dipstick   Collection Time: 10/23/17 11:40 AM  Result Value Ref Range   Color, UA     Clarity, UA     Glucose, UA neg    Bilirubin, UA     Ketones, UA neg    Spec Grav, UA  1.010 - 1.025   Blood, UA neg    pH, UA  5.0 - 8.0   Protein, UA 1+    Urobilinogen, UA  0.2 or 1.0 E.U./dL   Nitrite, UA neg    Leukocytes, UA Negative Negative   Appearance     Odor      Assessment & Plan:  1) Low-risk pregnancy G3P2002 at 1520w4d with an Estimated Date of Delivery: 11/16/17   2) Prev c/s, for RCS w/ BTL, needs scheduled, wants JVF, sent note, will schedule w/ him next week   Meds: No orders of the defined types were placed in this encounter.  Labs/procedures today: f/u u/s, gbs, gc/ct, sve  Plan:  Continue routine obstetrical care   Reviewed: Preterm labor symptoms and general obstetric precautions including but not limited to vaginal bleeding, contractions, leaking of fluid and fetal movement were reviewed in detail with the patient.  All questions were answered  Follow-up: Return in about 1 week (around 10/30/2017) for LROB w/ JVF.  Orders Placed This Encounter  Procedures  . GC/Chlamydia Probe Amp([facility])  . Culture, beta strep (group b only)  . POCT Urinalysis Dipstick   Cheral MarkerKimberly R Shontae Rosiles CNM, [facility]NP-BC 10/23/2017 1:14 PM

## 2017-10-25 LAB — GC/CHLAMYDIA PROBE AMP
CHLAMYDIA, DNA PROBE: NEGATIVE
NEISSERIA GONORRHOEAE BY PCR: NEGATIVE

## 2017-10-28 LAB — CULTURE, BETA STREP (GROUP B ONLY): Strep Gp B Culture: NEGATIVE

## 2017-10-29 ENCOUNTER — Encounter: Payer: BLUE CROSS/BLUE SHIELD | Admitting: Obstetrics and Gynecology

## 2017-10-30 ENCOUNTER — Ambulatory Visit (INDEPENDENT_AMBULATORY_CARE_PROVIDER_SITE_OTHER): Payer: BLUE CROSS/BLUE SHIELD | Admitting: Obstetrics & Gynecology

## 2017-10-30 ENCOUNTER — Other Ambulatory Visit: Payer: Self-pay

## 2017-10-30 ENCOUNTER — Encounter: Payer: Self-pay | Admitting: Obstetrics & Gynecology

## 2017-10-30 VITALS — BP 124/84 | HR 115 | Wt 198.0 lb

## 2017-10-30 DIAGNOSIS — Z3A37 37 weeks gestation of pregnancy: Secondary | ICD-10-CM

## 2017-10-30 DIAGNOSIS — Z3483 Encounter for supervision of other normal pregnancy, third trimester: Secondary | ICD-10-CM

## 2017-10-30 NOTE — Progress Notes (Signed)
[id number] 2734w4d Estimated Date of Delivery: 11/16/17  Blood pressure 124/84, pulse (!) 115, weight 198 lb (89.8 kg), last menstrual period 02/17/2017, unknown if currently breastfeeding.   BP weight and urine results all reviewed and noted.  Please refer to the obstetrical flow sheet for the fundal height and fetal heart rate documentation:  Patient reports good fetal movement, denies any bleeding and no rupture of membranes symptoms or regular contractions. Patient is without complaints. All questions were answered.  No orders of the defined types were placed in this encounter.   Plan:  Continued routine obstetrical care,   Return in about 1 week (around 11/06/2017) for LROB.

## 2017-11-03 ENCOUNTER — Telehealth: Payer: Self-pay | Admitting: *Deleted

## 2017-11-06 ENCOUNTER — Ambulatory Visit (INDEPENDENT_AMBULATORY_CARE_PROVIDER_SITE_OTHER): Payer: BLUE CROSS/BLUE SHIELD | Admitting: Women's Health

## 2017-11-06 ENCOUNTER — Encounter (HOSPITAL_COMMUNITY): Payer: Self-pay | Admitting: *Deleted

## 2017-11-06 ENCOUNTER — Encounter: Payer: Self-pay | Admitting: Women's Health

## 2017-11-06 ENCOUNTER — Other Ambulatory Visit: Payer: Self-pay | Admitting: Obstetrics & Gynecology

## 2017-11-06 VITALS — BP 110/80 | HR 98 | Wt 203.0 lb

## 2017-11-06 DIAGNOSIS — Z1389 Encounter for screening for other disorder: Secondary | ICD-10-CM

## 2017-11-06 DIAGNOSIS — Z3A38 38 weeks gestation of pregnancy: Secondary | ICD-10-CM | POA: Diagnosis not present

## 2017-11-06 DIAGNOSIS — Z3483 Encounter for supervision of other normal pregnancy, third trimester: Secondary | ICD-10-CM

## 2017-11-06 DIAGNOSIS — Z331 Pregnant state, incidental: Secondary | ICD-10-CM

## 2017-11-06 DIAGNOSIS — O34219 Maternal care for unspecified type scar from previous cesarean delivery: Secondary | ICD-10-CM | POA: Diagnosis not present

## 2017-11-06 LAB — POCT URINALYSIS DIPSTICK
Blood, UA: NEGATIVE
Glucose, UA: NEGATIVE
KETONES UA: NEGATIVE
LEUKOCYTES UA: NEGATIVE
NITRITE UA: NEGATIVE

## 2017-11-06 NOTE — Progress Notes (Signed)
   LOW-RISK PREGNANCY VISIT Patient name: Sherry Greene MRN [id number]  Date of birth: Nov 13, 1985 Chief Complaint:   low risk pregnancy  History of Present Illness:   Sherry Greene is a 32 y.o. G13P2002 female at [redacted]w[redacted]d with an Estimated Date of Delivery: 11/16/17 being seen today for ongoing management of a low-risk pregnancy.  Today she reports no complaints. Still doesn't have c/s scheduled. . Contractions: Irregular.  .  Movement: Present. denies leaking of fluid. Review of Systems:   Pertinent items are noted in HPI Denies abnormal vaginal discharge w/ itching/odor/irritation, headaches, visual changes, shortness of breath, chest pain, abdominal pain, severe nausea/vomiting, or problems with urination or bowel movements unless otherwise stated above. Pertinent History Reviewed:  Reviewed past medical,surgical, social, obstetrical and family history.  Reviewed problem list, medications and allergies. Physical Assessment:   Vitals:   11/06/17 0845  BP: 110/80  Pulse: 98  Weight: 203 lb (92.1 kg)  Body mass index is 33.78 kg/m.        Physical Examination:   General appearance: Well appearing, and in no distress  Mental status: Alert, oriented to person, place, and time  Skin: Warm & dry  Cardiovascular: Normal heart rate noted  Respiratory: Normal respiratory effort, no distress  Abdomen: Soft, gravid, nontender  Pelvic: Cervical exam performed  Dilation: 1 Effacement (%): Thick Station: -2  Extremities: Edema: Trace  Fetal Status: Fetal Heart Rate (bpm): 143 Fundal Height: 38 cm Movement: Present Presentation: Vertex  Results for orders placed or performed in visit on 11/06/17 (from the past 24 hour(s))  POCT urinalysis dipstick   Collection Time: 11/06/17  8:51 AM  Result Value Ref Range   Color, UA     Clarity, UA     Glucose, UA neg    Bilirubin, UA     Ketones, UA neg    Spec Grav, UA  1.010 - 1.025   Blood, UA neg    pH, UA  5.0 - 8.0   Protein, UA trace    Urobilinogen, UA  0.2 or 1.0 E.U./dL   Nitrite, UA neg    Leukocytes, UA Negative Negative   Appearance     Odor      Assessment & Plan:  1) Low-risk pregnancy G3P2002 at [redacted]w[redacted]d with an Estimated Date of Delivery: 11/16/17   2) Prev c/s x 2, for RCS, thinking she doesn't want BTL, leaning towards Nexplanon. Message for L[facility] to schedule   Meds: No orders of the defined types were placed in this encounter.  Labs/procedures today: sve  Plan:  Continue routine obstetrical care   Reviewed: Term labor symptoms and general obstetric precautions including but not limited to vaginal bleeding, contractions, leaking of fluid and fetal movement were reviewed in detail with the patient.  All questions were answered  Follow-up: Return in about 1 week (around 11/13/2017) for LROB. in case c/s not scheduled yet  Orders Placed This Encounter  Procedures  . POCT urinalysis dipstick   Cheral Marker CNM, [facility] 11/06/2017 9:17 AM

## 2017-11-06 NOTE — Patient Instructions (Signed)
Sherry Greene, I greatly value your feedback.  If you receive a survey following your visit with [place] today, we appreciate you taking the time to fill it out.  Thanks, Joellyn Haff, CNM, WHNP-BC   Call the office [phone]) or go to [facility] if:  You begin to have strong, frequent contractions  Your water breaks.  Sometimes it is a big gush of fluid, sometimes it is just a trickle that keeps getting your panties wet or running down your legs  You have vaginal bleeding.  It is normal to have a small amount of spotting if your cervix was checked.   You don't feel your baby moving like normal.  If you don't, get you something to eat and drink and lay down and focus on feeling your baby move.  You should feel at least 10 movements in 2 hours.  If you don't, you should call the office or go to [facility].     [facility] Contractions Contractions of the uterus can occur throughout pregnancy, but they are not always a sign that you are in labor. You may have practice contractions called Braxton Hicks contractions. These false labor contractions are sometimes confused with true labor. What are Deberah Pelton contractions? Braxton Hicks contractions are tightening movements that occur in the muscles of the uterus before labor. Unlike true labor contractions, these contractions do not result in opening (dilation) and thinning of the cervix. Toward the end of pregnancy (32-34 weeks), Braxton Hicks contractions can happen more often and may become stronger. These contractions are sometimes difficult to tell apart from true labor because they can be very uncomfortable. You should not feel embarrassed if you go to the hospital with false labor. Sometimes, the only way to tell if you are in true labor is for your health care provider to look for changes in the cervix. The health care provider will do a physical exam and may monitor your contractions. If you are not in true labor, the exam should  show that your cervix is not dilating and your water has not broken. If there are other health problems associated with your pregnancy, it is completely safe for you to be sent home with false labor. You may continue to have Braxton Hicks contractions until you go into true labor. How to tell the difference between true labor and false labor True labor  Contractions last 30-70 seconds.  Contractions become very regular.  Discomfort is usually felt in the top of the uterus, and it spreads to the lower abdomen and low back.  Contractions do not go away with walking.  Contractions usually become more intense and increase in frequency.  The cervix dilates and gets thinner. False labor  Contractions are usually shorter and not as strong as true labor contractions.  Contractions are usually irregular.  Contractions are often felt in the front of the lower abdomen and in the groin.  Contractions may go away when you walk around or change positions while lying down.  Contractions get weaker and are shorter-lasting as time goes on.  The cervix usually does not dilate or become thin. Follow these instructions at home:  Take over-the-counter and prescription medicines only as told by your health care provider.  Keep up with your usual exercises and follow other instructions from your health care provider.  Eat and drink lightly if you think you are going into labor.  If Braxton Hicks contractions are making you uncomfortable: ? Change your position from lying down  or resting to walking, or change from walking to resting. ? Sit and rest in a tub of warm water. ? Drink enough fluid to keep your urine pale yellow. Dehydration may cause these contractions. ? Do slow and deep breathing several times an hour.  Keep all follow-up prenatal visits as told by your health care provider. This is important. Contact a health care provider if:  You have a fever.  You have continuous pain in  your abdomen. Get help right away if:  Your contractions become stronger, more regular, and closer together.  You have fluid leaking or gushing from your vagina.  You pass blood-tinged mucus (bloody show).  You have bleeding from your vagina.  You have low back pain that you never had before.  You feel your baby's head pushing down and causing pelvic pressure.  Your baby is not moving inside you as much as it used to. Summary  Contractions that occur before labor are called Braxton Hicks contractions, false labor, or practice contractions.  Braxton Hicks contractions are usually shorter, weaker, farther apart, and less regular than true labor contractions. True labor contractions usually become progressively stronger and regular and they become more frequent.  Manage discomfort from [facility] contractions by changing position, resting in a warm bath, drinking plenty of water, or practicing deep breathing. This information is not intended to replace advice given to you by your health care provider. Make sure you discuss any questions you have with your health care provider. Document Released: 11/07/2016 Document Revised: 11/07/2016 Document Reviewed: 11/07/2016 Elsevier Interactive Patient Education  2018 [employer].

## 2017-11-06 NOTE — Telephone Encounter (Signed)
Patient informed she is scheduled for a c/s Sunday, May 5 @ 11:30 with Dr Debroah Loop.  [facility] states [facility] and told her to be there at 9am on Sunday for pre-op. Patient with no further questions.

## 2017-11-06 NOTE — Patient Instructions (Addendum)
Sherry Greene  11/06/2017   Your procedure is scheduled on:  11/10/2017  Enter through the Maternity Admissions of [facility] at 0900 AM.    Call this number if you have problems the morning of surgery:[phone]  Remember:   Do not eat food:(After Midnight) Desps de medianoche.  Do not drink clear liquids: (After Midnight) Desps de medianoche.  Take these medicines the morning of surgery with A SIP OF WATER: take your keppra   Do not wear jewelry, make-up or nail polish.  Do not wear lotions, powders, or perfumes. Do not wear deodorant.  Do not shave 48 hours prior to surgery.  Do not bring valuables to the hospital.  [facility] is not   responsible for any belongings or valuables brought to the hospital.  Contacts, dentures or bridgework may not be worn into surgery.  Leave suitcase in the car. After surgery it may be brought to your room.  For patients admitted to the hospital, checkout time is 11:00 AM the day of              discharge.    N/A   Please read over the following fact sheets that you were given:   Surgical Site Infection Prevention

## 2017-11-07 ENCOUNTER — Encounter (HOSPITAL_COMMUNITY)
Admission: RE | Admit: 2017-11-07 | Discharge: 2017-11-07 | Disposition: A | Discharge status: Home / Self Care | Payer: BLUE CROSS/BLUE SHIELD | Source: Ambulatory Visit | Admitting: Obstetrics & Gynecology

## 2017-11-07 NOTE — Pre-Procedure Instructions (Signed)
Pt no show for PAT visit.  Message left on voicemail and copy of instructions sent to e-mail on chart

## 2017-11-09 ENCOUNTER — Inpatient Hospital Stay (HOSPITAL_COMMUNITY): Payer: BLUE CROSS/BLUE SHIELD | Admitting: Anesthesiology

## 2017-11-09 ENCOUNTER — Inpatient Hospital Stay (HOSPITAL_COMMUNITY)
Admission: RE | Admit: 2017-11-09 | Discharge: 2017-11-12 | DRG: 787 | Disposition: A | Discharge status: Home / Self Care | Payer: BLUE CROSS/BLUE SHIELD | Source: Ambulatory Visit | Attending: Obstetrics & Gynecology | Admitting: Obstetrics & Gynecology

## 2017-11-09 ENCOUNTER — Encounter (HOSPITAL_COMMUNITY): Payer: Self-pay | Admitting: Anesthesiology

## 2017-11-09 ENCOUNTER — Encounter (HOSPITAL_COMMUNITY)
Admission: RE | Disposition: A | Discharge status: Home / Self Care | Payer: Self-pay | Source: Ambulatory Visit | Attending: Obstetrics & Gynecology

## 2017-11-09 DIAGNOSIS — Z87891 Personal history of nicotine dependence: Secondary | ICD-10-CM | POA: Diagnosis not present

## 2017-11-09 DIAGNOSIS — O99354 Diseases of the nervous system complicating childbirth: Secondary | ICD-10-CM | POA: Diagnosis present

## 2017-11-09 DIAGNOSIS — D649 Anemia, unspecified: Secondary | ICD-10-CM | POA: Diagnosis present

## 2017-11-09 DIAGNOSIS — G40909 Epilepsy, unspecified, not intractable, without status epilepticus: Secondary | ICD-10-CM | POA: Diagnosis present

## 2017-11-09 DIAGNOSIS — O9902 Anemia complicating childbirth: Secondary | ICD-10-CM | POA: Diagnosis present

## 2017-11-09 DIAGNOSIS — O0930 Supervision of pregnancy with insufficient antenatal care, unspecified trimester: Secondary | ICD-10-CM

## 2017-11-09 DIAGNOSIS — F1290 Cannabis use, unspecified, uncomplicated: Secondary | ICD-10-CM | POA: Diagnosis present

## 2017-11-09 DIAGNOSIS — Z3A39 39 weeks gestation of pregnancy: Secondary | ICD-10-CM

## 2017-11-09 DIAGNOSIS — Z3490 Encounter for supervision of normal pregnancy, unspecified, unspecified trimester: Secondary | ICD-10-CM

## 2017-11-09 DIAGNOSIS — O99324 Drug use complicating childbirth: Secondary | ICD-10-CM | POA: Diagnosis present

## 2017-11-09 DIAGNOSIS — O1495 Unspecified pre-eclampsia, complicating the puerperium: Secondary | ICD-10-CM | POA: Diagnosis not present

## 2017-11-09 DIAGNOSIS — Z98891 History of uterine scar from previous surgery: Secondary | ICD-10-CM

## 2017-11-09 DIAGNOSIS — O34211 Maternal care for low transverse scar from previous cesarean delivery: Secondary | ICD-10-CM | POA: Diagnosis present

## 2017-11-09 LAB — COMPREHENSIVE METABOLIC PANEL
ALBUMIN: 2.2 g/dL — AB (ref 3.5–5.0)
ALT: 12 U/L — AB (ref 14–54)
ANION GAP: 9 (ref 5–15)
AST: 19 U/L (ref 15–41)
Alkaline Phosphatase: 108 U/L (ref 38–126)
BUN: 7 mg/dL (ref 6–20)
CALCIUM: 8.3 mg/dL — AB (ref 8.9–10.3)
CHLORIDE: 104 mmol/L (ref 101–111)
CO2: 20 mmol/L — AB (ref 22–32)
Creatinine, Ser: 0.39 mg/dL — ABNORMAL LOW (ref 0.44–1.00)
GFR calc non Af Amer: >60 mL/min (ref >60)
GLUCOSE: 80 mg/dL (ref 65–99)
Potassium: 3.9 mmol/L (ref 3.5–5.1)
SODIUM: 133 mmol/L — AB (ref 135–145)
Total Bilirubin: 0.6 mg/dL (ref 0.3–1.2)
Total Protein: 6.1 g/dL — ABNORMAL LOW (ref 6.5–8.1)

## 2017-11-09 LAB — TYPE AND SCREEN
ABO/RH(D): A POS
Antibody Screen: NEGATIVE

## 2017-11-09 LAB — ABO/RH: ABO/RH(D): A POS

## 2017-11-09 LAB — CBC
HCT: 30.4 % — ABNORMAL LOW (ref 36.0–46.0)
Hemoglobin: 10.2 g/dL — ABNORMAL LOW (ref 12.0–15.0)
MCH: 28.8 pg (ref 26.0–34.0)
MCHC: 33.6 g/dL (ref 30.0–36.0)
MCV: 85.9 fL (ref 78.0–100.0)
PLATELETS: 236 10*3/uL (ref 150–400)
RBC: 3.54 MIL/uL — AB (ref 3.87–5.11)
RDW: 14.9 % (ref 11.5–15.5)
WBC: 14.5 10*3/uL — ABNORMAL HIGH (ref 4.0–10.5)

## 2017-11-09 LAB — RAPID URINE DRUG SCREEN, HOSP PERFORMED
AMPHETAMINES: NOT DETECTED
Barbiturates: NOT DETECTED
Benzodiazepines: NOT DETECTED
COCAINE: NOT DETECTED
OPIATES: NOT DETECTED
TETRAHYDROCANNABINOL: POSITIVE — AB

## 2017-11-09 LAB — PROTEIN / CREATININE RATIO, URINE
Creatinine, Urine: 54 mg/dL
PROTEIN CREATININE RATIO: 0.33 mg/mg{creat} — AB (ref 0.00–0.15)
Total Protein, Urine: 18 mg/dL

## 2017-11-09 SURGERY — Surgical Case
Anesthesia: Spinal | Laterality: Bilateral

## 2017-11-09 MED ORDER — DIBUCAINE 1 % RE OINT: 1.0000 "application " | TOPICAL_OINTMENT | RECTAL | Status: DC | PRN

## 2017-11-09 MED ORDER — KETOROLAC TROMETHAMINE 30 MG/ML IJ SOLN: 30.0000 mg | Freq: Four times a day (QID) | INTRAMUSCULAR | Status: AC | PRN

## 2017-11-09 MED ORDER — TETANUS-DIPHTH-ACELL PERTUSSIS 5-2.5-18.5 LF-MCG/0.5 IM SUSP: 0.5000 mL | Freq: Once | INTRAMUSCULAR | Status: DC

## 2017-11-09 MED ORDER — SODIUM CHLORIDE 0.9% FLUSH
3.0000 mL | INTRAVENOUS | Status: DC | PRN
Start: 2017-11-09 — End: 2017-11-12

## 2017-11-09 MED ORDER — BUPIVACAINE HCL (PF) 0.5 % IJ SOLN
INTRAMUSCULAR | Status: DC | PRN
  Administered 2017-11-09: 30 mL

## 2017-11-09 MED ORDER — SCOPOLAMINE 1 MG/3DAYS TD PT72
1.0000 | MEDICATED_PATCH | Freq: Once | TRANSDERMAL | Status: DC
  Filled 2017-11-09: qty 1

## 2017-11-09 MED ORDER — NALOXONE HCL 4 MG/10ML IJ SOLN
1.0000 ug/kg/h | INTRAVENOUS | Status: DC | PRN
  Filled 2017-11-09: qty 5

## 2017-11-09 MED ORDER — CEFAZOLIN SODIUM-DEXTROSE 2-4 GM/100ML-% IV SOLN
2.0000 g | INTRAVENOUS | Status: AC
  Administered 2017-11-09: 2 g via INTRAVENOUS

## 2017-11-09 MED ORDER — OXYCODONE HCL 5 MG PO TABS
10.0000 mg | ORAL_TABLET | ORAL | Status: DC | PRN
  Administered 2017-11-11 – 2017-11-12 (×3): 10 mg via ORAL
  Filled 2017-11-09 (×3): qty 2

## 2017-11-09 MED ORDER — ONDANSETRON HCL 4 MG/2ML IJ SOLN
INTRAMUSCULAR | Status: DC | PRN
  Administered 2017-11-09: 4 mg via INTRAVENOUS

## 2017-11-09 MED ORDER — NALBUPHINE HCL 10 MG/ML IJ SOLN: 5.0000 mg | INTRAMUSCULAR | Status: DC | PRN

## 2017-11-09 MED ORDER — ENOXAPARIN SODIUM 40 MG/0.4ML ~~LOC~~ SOLN
40.0000 mg | SUBCUTANEOUS | Status: DC
  Administered 2017-11-10: 40 mg via SUBCUTANEOUS
  Filled 2017-11-09: qty 0.4

## 2017-11-09 MED ORDER — IBUPROFEN 600 MG PO TABS
600.0000 mg | ORAL_TABLET | Freq: Four times a day (QID) | ORAL | Status: DC
  Administered 2017-11-09 – 2017-11-12 (×11): 600 mg via ORAL
  Filled 2017-11-09 (×11): qty 1

## 2017-11-09 MED ORDER — HYDRALAZINE HCL 20 MG/ML IJ SOLN
10.0000 mg | Freq: Once | INTRAMUSCULAR | Status: AC
  Administered 2017-11-09: 10 mg via INTRAVENOUS

## 2017-11-09 MED ORDER — DIPHENHYDRAMINE HCL 50 MG/ML IJ SOLN: 12.5000 mg | INTRAMUSCULAR | Status: DC | PRN

## 2017-11-09 MED ORDER — ZOLPIDEM TARTRATE 5 MG PO TABS: 5.0000 mg | ORAL_TABLET | Freq: Every evening | ORAL | Status: DC | PRN

## 2017-11-09 MED ORDER — NALOXONE HCL 0.4 MG/ML IJ SOLN: 0.4000 mg | INTRAMUSCULAR | Status: DC | PRN

## 2017-11-09 MED ORDER — OXYCODONE HCL 5 MG PO TABS
5.0000 mg | ORAL_TABLET | ORAL | Status: DC | PRN
  Administered 2017-11-10 – 2017-11-11 (×6): 5 mg via ORAL
  Filled 2017-11-09 (×7): qty 1

## 2017-11-09 MED ORDER — LACTATED RINGERS IV SOLN
INTRAVENOUS | Status: DC | PRN
  Administered 2017-11-09 (×2): via INTRAVENOUS

## 2017-11-09 MED ORDER — SODIUM CHLORIDE 0.9 % IR SOLN
Status: DC | PRN
  Administered 2017-11-09: 1000 mL

## 2017-11-09 MED ORDER — LACTATED RINGERS IV SOLN
INTRAVENOUS | Status: DC | PRN
  Administered 2017-11-09: 13:00:00 via INTRAVENOUS

## 2017-11-09 MED ORDER — MORPHINE SULFATE (PF) 0.5 MG/ML IJ SOLN
INTRAMUSCULAR | Status: AC
  Filled 2017-11-09: qty 10

## 2017-11-09 MED ORDER — PHENYLEPHRINE 8 MG IN D5W 100 ML (0.08MG/ML) PREMIX OPTIME
INJECTION | INTRAVENOUS | Status: DC | PRN
  Administered 2017-11-09: 60 ug/min via INTRAVENOUS

## 2017-11-09 MED ORDER — WITCH HAZEL-GLYCERIN EX PADS: 1.0000 "application " | MEDICATED_PAD | CUTANEOUS | Status: DC | PRN

## 2017-11-09 MED ORDER — SCOPOLAMINE 1 MG/3DAYS TD PT72
MEDICATED_PATCH | TRANSDERMAL | Status: DC | PRN
  Administered 2017-11-09: 1 via TRANSDERMAL

## 2017-11-09 MED ORDER — MENTHOL 3 MG MT LOZG: 1.0000 | LOZENGE | OROMUCOSAL | Status: DC | PRN

## 2017-11-09 MED ORDER — DEXAMETHASONE SODIUM PHOSPHATE 4 MG/ML IJ SOLN
INTRAMUSCULAR | Status: DC | PRN
  Administered 2017-11-09: 4 mg via INTRAVENOUS

## 2017-11-09 MED ORDER — SIMETHICONE 80 MG PO CHEW
80.0000 mg | CHEWABLE_TABLET | ORAL | Status: DC | PRN
  Administered 2017-11-10: 80 mg via ORAL

## 2017-11-09 MED ORDER — HYDRALAZINE HCL 20 MG/ML IJ SOLN
10.0000 mg | Freq: Once | INTRAMUSCULAR | Status: DC | PRN
  Filled 2017-11-09: qty 1

## 2017-11-09 MED ORDER — HYDROMORPHONE HCL 1 MG/ML IJ SOLN
INTRAMUSCULAR | Status: AC
  Filled 2017-11-09: qty 1

## 2017-11-09 MED ORDER — ONDANSETRON HCL 4 MG/2ML IJ SOLN
INTRAMUSCULAR | Status: AC
  Filled 2017-11-09: qty 2

## 2017-11-09 MED ORDER — LABETALOL HCL 5 MG/ML IV SOLN: 20.0000 mg | INTRAVENOUS | Status: DC | PRN

## 2017-11-09 MED ORDER — DIPHENHYDRAMINE HCL 25 MG PO CAPS: 25.0000 mg | ORAL_CAPSULE | Freq: Four times a day (QID) | ORAL | Status: DC | PRN

## 2017-11-09 MED ORDER — HYDROCODONE-ACETAMINOPHEN 7.5-325 MG PO TABS: 1.0000 | ORAL_TABLET | Freq: Once | ORAL | Status: DC | PRN

## 2017-11-09 MED ORDER — PHENYLEPHRINE 40 MCG/ML (10ML) SYRINGE FOR IV PUSH (FOR BLOOD PRESSURE SUPPORT)
PREFILLED_SYRINGE | INTRAVENOUS | Status: AC
  Filled 2017-11-09: qty 10

## 2017-11-09 MED ORDER — ACETAMINOPHEN 325 MG PO TABS: 650.0000 mg | ORAL_TABLET | ORAL | Status: DC | PRN

## 2017-11-09 MED ORDER — NALBUPHINE HCL 10 MG/ML IJ SOLN: 5.0000 mg | Freq: Once | INTRAMUSCULAR | Status: DC | PRN

## 2017-11-09 MED ORDER — OXYTOCIN 40 UNITS IN LACTATED RINGERS INFUSION - SIMPLE MED: 2.5000 [IU]/h | INTRAVENOUS | Status: AC

## 2017-11-09 MED ORDER — BUPIVACAINE IN DEXTROSE 0.75-8.25 % IT SOLN
INTRATHECAL | Status: DC | PRN
  Administered 2017-11-09: 1.6 mL via INTRATHECAL

## 2017-11-09 MED ORDER — SENNOSIDES-DOCUSATE SODIUM 8.6-50 MG PO TABS
2.0000 | ORAL_TABLET | ORAL | Status: DC
  Administered 2017-11-09 – 2017-11-10 (×2): 2 via ORAL
  Filled 2017-11-09 (×3): qty 2

## 2017-11-09 MED ORDER — BUPIVACAINE IN DEXTROSE 0.75-8.25 % IT SOLN
INTRATHECAL | Status: AC
  Filled 2017-11-09: qty 2

## 2017-11-09 MED ORDER — DIPHENHYDRAMINE HCL 25 MG PO CAPS: 25.0000 mg | ORAL_CAPSULE | ORAL | Status: DC | PRN

## 2017-11-09 MED ORDER — PHENYLEPHRINE 8 MG IN D5W 100 ML (0.08MG/ML) PREMIX OPTIME
INJECTION | INTRAVENOUS | Status: AC
  Filled 2017-11-09: qty 100

## 2017-11-09 MED ORDER — MEPERIDINE HCL 25 MG/ML IJ SOLN: 6.2500 mg | INTRAMUSCULAR | Status: DC | PRN

## 2017-11-09 MED ORDER — BUPIVACAINE HCL (PF) 0.5 % IJ SOLN
INTRAMUSCULAR | Status: AC
  Filled 2017-11-09: qty 30

## 2017-11-09 MED ORDER — METOCLOPRAMIDE HCL 5 MG/ML IJ SOLN
INTRAMUSCULAR | Status: DC | PRN
  Administered 2017-11-09 (×2): 5 mg via INTRAVENOUS

## 2017-11-09 MED ORDER — OXYTOCIN 10 UNIT/ML IJ SOLN
INTRAMUSCULAR | Status: AC
Start: 2017-11-09 — End: 2017-11-09
  Filled 2017-11-09: qty 4

## 2017-11-09 MED ORDER — HYDROMORPHONE HCL 1 MG/ML IJ SOLN
0.2500 mg | INTRAMUSCULAR | Status: DC | PRN
  Administered 2017-11-09 (×2): 0.25 mg via INTRAVENOUS

## 2017-11-09 MED ORDER — MORPHINE SULFATE (PF) 0.5 MG/ML IJ SOLN
INTRAMUSCULAR | Status: DC | PRN
  Administered 2017-11-09: .2 ug via INTRATHECAL

## 2017-11-09 MED ORDER — LACTATED RINGERS IV SOLN
INTRAVENOUS | Status: DC
  Administered 2017-11-09: 22:00:00 via INTRAVENOUS

## 2017-11-09 MED ORDER — ACETAMINOPHEN 10 MG/ML IV SOLN: 1000.0000 mg | Freq: Once | INTRAVENOUS | Status: DC | PRN

## 2017-11-09 MED ORDER — COCONUT OIL OIL: 1.0000 "application " | TOPICAL_OIL | Status: DC | PRN

## 2017-11-09 MED ORDER — ONDANSETRON HCL 4 MG/2ML IJ SOLN: 4.0000 mg | Freq: Three times a day (TID) | INTRAMUSCULAR | Status: DC | PRN

## 2017-11-09 MED ORDER — SIMETHICONE 80 MG PO CHEW
80.0000 mg | CHEWABLE_TABLET | Freq: Three times a day (TID) | ORAL | Status: DC
  Administered 2017-11-10 – 2017-11-12 (×7): 80 mg via ORAL
  Filled 2017-11-09 (×6): qty 1

## 2017-11-09 MED ORDER — OXYTOCIN 10 UNIT/ML IJ SOLN
INTRAVENOUS | Status: DC | PRN
  Administered 2017-11-09: 40 [IU] via INTRAVENOUS

## 2017-11-09 MED ORDER — PRENATAL MULTIVITAMIN CH
1.0000 | ORAL_TABLET | Freq: Every day | ORAL | Status: DC
  Administered 2017-11-10 – 2017-11-12 (×3): 1 via ORAL
  Filled 2017-11-09 (×3): qty 1

## 2017-11-09 MED ORDER — PROMETHAZINE HCL 25 MG/ML IJ SOLN: 6.2500 mg | INTRAMUSCULAR | Status: DC | PRN

## 2017-11-09 MED ORDER — DEXAMETHASONE SODIUM PHOSPHATE 4 MG/ML IJ SOLN
INTRAMUSCULAR | Status: AC
  Filled 2017-11-09: qty 1

## 2017-11-09 MED ORDER — SIMETHICONE 80 MG PO CHEW
80.0000 mg | CHEWABLE_TABLET | ORAL | Status: DC
  Administered 2017-11-09 – 2017-11-12 (×2): 80 mg via ORAL
  Filled 2017-11-09 (×3): qty 1

## 2017-11-09 SURGICAL SUPPLY — 45 items
ADH SKN CLS APL DERMABOND .7 (GAUZE/BANDAGES/DRESSINGS) ×2
APL SKNCLS STERI-STRIP NONHPOA (GAUZE/BANDAGES/DRESSINGS) ×1
BENZOIN TINCTURE PRP APPL 2/3 (GAUZE/BANDAGES/DRESSINGS) ×2 IMPLANT
CLAMP CORD UMBIL (MISCELLANEOUS) IMPLANT
CLOSURE STERI STRIP 1/2 X4 (GAUZE/BANDAGES/DRESSINGS) ×2 IMPLANT
CLOTH BEACON ORANGE TIMEOUT ST (SAFETY) ×3 IMPLANT
DERMABOND ADVANCED (GAUZE/BANDAGES/DRESSINGS) ×4
DERMABOND ADVANCED .7 DNX12 (GAUZE/BANDAGES/DRESSINGS) ×2 IMPLANT
DRSG OPSITE POSTOP 4X10 (GAUZE/BANDAGES/DRESSINGS) ×3 IMPLANT
DURAPREP 26ML APPLICATOR (WOUND CARE) ×6 IMPLANT
ELECT REM PT RETURN 9FT ADLT (ELECTROSURGICAL) ×3
ELECTRODE REM PT RTRN 9FT ADLT (ELECTROSURGICAL) ×1 IMPLANT
EXTRACTOR VACUUM BELL STYLE (SUCTIONS) IMPLANT
GLOVE BIOGEL PI IND STRL 7.0 (GLOVE) ×1 IMPLANT
GLOVE BIOGEL PI IND STRL 8 (GLOVE) ×1 IMPLANT
GLOVE BIOGEL PI INDICATOR 7.0 (GLOVE) ×2
GLOVE BIOGEL PI INDICATOR 8 (GLOVE) ×2
GLOVE ECLIPSE 8.0 STRL XLNG CF (GLOVE) ×3 IMPLANT
GOWN STRL REUS W/TWL LRG LVL3 (GOWN DISPOSABLE) ×6 IMPLANT
KIT ABG SYR 3ML LUER SLIP (SYRINGE) ×3 IMPLANT
NDL HYPO 18GX1.5 BLUNT FILL (NEEDLE) ×1 IMPLANT
NDL HYPO 25X5/8 SAFETYGLIDE (NEEDLE) ×1 IMPLANT
NEEDLE HYPO 18GX1.5 BLUNT FILL (NEEDLE) ×3 IMPLANT
NEEDLE HYPO 22GX1.5 SAFETY (NEEDLE) ×3 IMPLANT
NEEDLE HYPO 25X5/8 SAFETYGLIDE (NEEDLE) ×3 IMPLANT
NS IRRIG 1000ML POUR BTL (IV SOLUTION) ×3 IMPLANT
PACK C SECTION WH (CUSTOM PROCEDURE TRAY) ×3 IMPLANT
PAD ABD 7.5X8 STRL (GAUZE/BANDAGES/DRESSINGS) ×4 IMPLANT
PAD OB MATERNITY 4.3X12.25 (PERSONAL CARE ITEMS) ×3 IMPLANT
PENCIL SMOKE EVAC W/HOLSTER (ELECTROSURGICAL) ×3 IMPLANT
RETRACTOR TRAXI PANNICULUS (MISCELLANEOUS) IMPLANT
RTRCTR C-SECT PINK 25CM LRG (MISCELLANEOUS) IMPLANT
SUT CHROMIC 0 CT 1 (SUTURE) ×3 IMPLANT
SUT MNCRL 0 VIOLET CTX 36 (SUTURE) ×2 IMPLANT
SUT MONOCRYL 0 CTX 36 (SUTURE) ×4
SUT PLAIN 2 0 (SUTURE) ×6
SUT PLAIN 2 0 XLH (SUTURE) IMPLANT
SUT PLAIN ABS 2-0 CT1 27XMFL (SUTURE) ×2 IMPLANT
SUT VIC AB 0 CTX 36 (SUTURE) ×3
SUT VIC AB 0 CTX36XBRD ANBCTRL (SUTURE) ×1 IMPLANT
SUT VIC AB 4-0 KS 27 (SUTURE) IMPLANT
SYR 20CC LL (SYRINGE) ×6 IMPLANT
TOWEL OR 17X24 6PK STRL BLUE (TOWEL DISPOSABLE) ×3 IMPLANT
TRAXI PANNICULUS RETRACTOR (MISCELLANEOUS) ×2
TRAY FOLEY W/BAG SLVR 14FR LF (SET/KITS/TRAYS/PACK) IMPLANT

## 2017-11-09 NOTE — Anesthesia Postprocedure Evaluation (Signed)
Anesthesia Post Note  Patient: [place] Hohman  Procedure(s) Performed: CESAREAN SECTION (Bilateral )     Patient location during evaluation: PACU Anesthesia Type: Spinal Level of consciousness: oriented and awake and alert Pain management: pain level controlled Vital Signs Assessment: post-procedure vital signs reviewed and stable Respiratory status: spontaneous breathing, respiratory function stable and patient connected to nasal cannula oxygen Cardiovascular status: blood pressure returned to baseline and stable Postop Assessment: no headache, no backache and no apparent nausea or vomiting Anesthetic complications: no    Last Vitals:  Vitals:   11/09/17 1416 11/09/17 1430  BP: (!) 134/93 (!) 140/96  Pulse: 81 82  Resp:  13  Temp:    SpO2: 99% 100%    Last Pain:  Vitals:   11/09/17 1430  TempSrc:   PainSc: 5    Pain Goal:                 Trevor Iha

## 2017-11-09 NOTE — Progress Notes (Signed)
Doppler [id number] at 1130 am [employer], [place] 11/09/2017 11:31 AM

## 2017-11-09 NOTE — Anesthesia Procedure Notes (Signed)
Spinal  Patient location during procedure: OB Start time: 11/09/2017 12:38 PM End time: 11/09/2017 12:46 PM Staffing Anesthesiologist: Trevor Iha, MD Performed: anesthesiologist  Preanesthetic Checklist Completed: patient identified, surgical consent, pre-op evaluation, timeout performed, IV checked, risks and benefits discussed and monitors and equipment checked Spinal Block Patient position: sitting Prep: site prepped and draped and DuraPrep Patient monitoring: heart rate, cardiac monitor, continuous pulse ox and blood pressure Approach: midline Location: L3-4 Injection technique: single-shot Needle Needle type: Pencan  Needle gauge: 24 G Needle length: 10 cm Assessment Sensory level: T4

## 2017-11-09 NOTE — Transfer of Care (Signed)
Immediate Anesthesia Transfer of Care Note  Patient: Sherry Greene  Procedure(s) Performed: CESAREAN SECTION (Bilateral )  Patient Location: PACU  Anesthesia Type:Spinal  Level of Consciousness: awake, alert  and oriented  Airway & Oxygen Therapy: Patient Spontanous Breathing  Post-op Assessment: Report given to RN and Post -op Vital signs reviewed and stable  Post vital signs: Reviewed and stable  Last Vitals:  Vitals Value Taken Time  BP 134/93 11/09/2017  2:15 PM  Temp    Pulse 81 11/09/2017  2:17 PM  Resp 18 11/09/2017  2:17 PM  SpO2 98 % 11/09/2017  2:17 PM  Vitals shown include unvalidated device data.  Last Pain:  Vitals:   11/09/17 1110  TempSrc: Oral         Complications: No apparent anesthesia complications

## 2017-11-09 NOTE — H&P (Signed)
Obstetric Preoperative History and Physical  Shanekia Latella is a 32 y.o. [id number] with IUP at [redacted]w[redacted]d presenting for scheduled repeat cesarean section.  No acute concerns.   Prenatal Course Source of Care: [facility]  with onset of care at 18 weeks Pregnancy complications or risks: Patient Active Problem List   Diagnosis Date Noted  . Family history of birth defect 06/19/2017  . History of cesarean delivery 06/18/2017  . Supervision of normal pregnancy 06/18/2017  . Marijuana use 06/18/2017  . BV (bacterial vaginosis) 06/18/2017  . Late prenatal care 06/18/2017  . History of seizures 04/14/2017   She plans to bottle feed She desires Nexplanon for postpartum contraception.   Prenatal labs and studies: ABO, Rh: A/Positive/-- (12/12 1441) Antibody: Negative (02/06 0904) Rubella: 3.24 (12/12 1441) RPR: Non Reactive (02/06 0904)  HBsAg: Negative (12/12 1441)  HIV: Non Reactive (02/06 0904)  GBS: negative 2-hr GTT:  normal Genetic screening too late; AFP negative Anatomy US bilateral pyelectasis; resolved on f/u ultrasound  Prenatal Transfer Tool  Maternal Diabetes: No Genetic Screening: too late Maternal Ultrasounds/Referrals: Normal Fetal Ultrasounds or other Referrals:  None Maternal Substance Abuse:  No Significant Maternal Medications:  None Significant Maternal Lab Results: Lab values include: Group B Strep negative  Past Medical History:  Diagnosis Date  . Seizures (HCC)    1 month ago    Past Surgical History:  Procedure Laterality Date  . CESAREAN SECTION      OB History  Gravida Para Term Preterm AB Living  0 0 2  SAB TAB Ectopic Multiple Live Births  0 0 0 0 2    # Outcome Date GA Lbr Len/2nd Weight Sex Delivery Anes PTL Lv  3 Current           2 Term 10/21/11 [redacted]w[redacted]d  6 lb 6 oz (2.892 kg) M CS-LTranv Spinal N LIV  1 Term 01/03/10 [redacted]w[redacted]d  6 lb 9 oz (2.977 kg) F CS-LTranv EPI N LIV     Complications: Failure to Progress in First Stage    Social  History   Socioeconomic History  . Marital status: Married    Spouse name: Not on file  . Number of children: Not on file  . Years of education: Not on file  . Highest education level: Not on file  Occupational History  . Not on file  Social Needs  . Financial resource strain: Not on file  . Food insecurity:    Worry: Not on file    Inability: Not on file  . Transportation needs:    Medical: Not on file    Non-medical: Not on file  Tobacco Use  . Smoking status: Former Games developer  . Smokeless tobacco: Never Used  Substance and Sexual Activity  . Alcohol use: No  . Drug use: Yes    Types: Marijuana    Comment: last used 07/2017  . Sexual activity: Yes    Birth control/protection: None  Lifestyle  . Physical activity:    Days per week: Not on file    Minutes per session: Not on file  . Stress: Not on file  Relationships  . Social connections:    Talks on phone: Not on file    Gets together: Not on file    Attends religious service: Not on file    Active member of club or organization: Not on file    Attends meetings of clubs or organizations: Not on file    Relationship status: Not on file  Other Topics Concern  . Not on file  Social History Narrative  . Not on file    Family History  Problem Relation Age of Onset  . Diabetes Maternal Grandmother   . Cancer Maternal Grandmother   . Cancer Maternal Grandfather        lung   . Asthma Mother   . Bronchiolitis Mother   . Kidney disease Son        born with one kidney  . Asthma Son     Medications Prior to Admission  Medication Sig Dispense Refill Last Dose  . ferrous sulfate 325 (65 FE) MG tablet Take 1 tablet (325 mg total) by mouth 2 (two) times daily with a meal. 60 tablet 3 Taking  . levETIRAcetam (KEPPRA) 500 MG tablet Take 1 tablet (500 mg total) by mouth 2 (two) times daily. 30 tablet 0 11/06/2017 at Unknown time  . Prenatal Vit-Fe Fumarate-FA (PRENATAL VITAMIN PO) Take 1 tablet by mouth daily.    11/06/2017 at  Unknown time  . promethazine (PHENERGAN) 25 MG tablet Take 0.5-1 tablets (12.5-25 mg total) by mouth every 6 (six) hours as needed for nausea or vomiting. 30 tablet 0 Taking    No Known Allergies  Review of Systems: Negative except for what is mentioned in HPI.  Physical Exam: BP 129/79   Pulse 90   Temp 98.5 F (36.9 C) (Oral)   Resp 18   Ht  (1.626 m)   Wt 204 lb 9.6 oz (92.8 kg)   LMP 02/17/2017 (Approximate)   BMI 35.12 kg/m   CONSTITUTIONAL: Well-developed, well-nourished female in no acute distress.  HENT:  Normocephalic, atraumatic. Oropharynx is clear and moist EYES: Conjunctivae and EOM are normal. No scleral icterus.  NECK: Normal range of motion, supple SKIN: Skin is warm and dry. No rash noted. Not diaphoretic. No erythema. NEUROLGIC: Alert and oriented to person, place, and time. Normal reflexes, muscle tone coordination. No cranial nerve deficit noted. PSYCHIATRIC: Normal mood and affect. Normal behavior. CARDIOVASCULAR: Normal heart rate noted, regular rhythm RESPIRATORY: Effort and breath sounds normal, no problems with respiration noted ABDOMEN: Soft, nontender, nondistended, gravid. Well-healed Pfannenstiel incision. PELVIC: Deferred MUSCULOSKELETAL: Normal range of motion. No edema and no tenderness. 2+ distal pulses.   Pertinent Labs/Studies:   No results found for this or any previous visit (from the past 72 hour(s)).  Assessment and Plan :Kimila Papaleo is a 32 y.o. G3P2002 at [redacted]w[redacted]d being admitted for scheduled cesarean section. The risks of cesarean section discussed with the patient included but were not limited to: bleeding which may require transfusion or reoperation; infection which may require antibiotics; injury to bowel, bladder, ureters or other surrounding organs; injury to the fetus; need for additional procedures including hysterectomy in the event of a life-threatening hemorrhage; placental abnormalities wth subsequent pregnancies,  incisional problems, thromboembolic phenomenon and other postoperative/anesthesia complications. The patient concurred with the proposed plan, giving informed written consent for the procedure. Patient has been NPO since last night she will remain NPO for procedure. Anesthesia and OR aware. Preoperative prophylactic antibiotics and SCDs ordered on call to the OR. To OR when ready.    Raynelle Fanning P. Degele, MD OB Fellow Faculty Practice, [facility]  Attestation of Attending Supervision of Obstetric Fellow: Evaluation and management procedures were performed by the Obstetric Fellow under my supervision and collaboration.  I have reviewed the Obstetric Fellow's note and chart, and I agree with the management and plan.  Scheryl Darter, MD, [facility] Attending  Obstetrician & Microbiologist, [facility]

## 2017-11-09 NOTE — Progress Notes (Signed)
Patient called at [phone] by this RN. Stated she was running late due to car trouble but was now on her way. She is "coming from [place] so it will be a little while." Dr Despina Hidden notified at 0945, stated Dr Debroah Loop will be doing case.  Laroy Mustard [place], [place] 11/09/2017 9:48 AM

## 2017-11-09 NOTE — Op Note (Addendum)
Cesarean Section Operative Report  PATIENT: Sherry Greene  PROCEDURE DATE: 11/09/2017  PREOPERATIVE DIAGNOSES: Intrauterine pregnancy at [redacted]w[redacted]d weeks gestation; Prior Cesarean Section x 2  POSTOPERATIVE DIAGNOSES: The same  PROCEDURE: Repeat Low Transverse Cesarean Section  SURGEON:   Surgeon(s) and Role:    * Adam Phenix, MD - Primary - Attending    * Nolene Ebbs, MD - OB Fellow   INDICATIONS: Sherry Greene is a 32 y.o. G3P2002 at [redacted]w[redacted]d here for cesarean section secondary to the indications listed under preoperative diagnoses; please see preoperative note for further details.  The risks of cesarean section were discussed with the patient including but were not limited to: bleeding which may require transfusion or reoperation; infection which may require antibiotics; injury to bowel, bladder, ureters or other surrounding organs; injury to the fetus; need for additional procedures including hysterectomy in the event of a life-threatening hemorrhage; placental abnormalities wth subsequent pregnancies, incisional problems, thromboembolic phenomenon and other postoperative/anesthesia complications.   The patient concurred with the proposed plan, giving informed written consent for the procedure.    FINDINGS:  Viable female infant in cephalic presentation.  Apgars 8 and 9.  Clear amniotic fluid.  Intact placenta, three vessel cord.  Normal uterus, fallopian tubes and ovaries bilaterally.  ANESTHESIA: Spinal INTRAVENOUS FLUIDS: 2500 mL ESTIMATED BLOOD LOSS: 394 mL URINE OUTPUT:  100 ml SPECIMENS: Placenta sent to L&D COMPLICATIONS: None immediate  PROCEDURE IN DETAIL:  The patient preoperatively received intravenous antibiotics and had sequential compression devices applied to her lower extremities.  She was then taken to the operating room where spinal anesthesia was administered and was found to be adequate. She was then placed in a dorsal supine position with a leftward tilt, and  prepped and draped in a sterile manner.  A foley catheter was placed into her bladder and attached to constant gravity.    After an adequate timeout was performed, a Pfannenstiel skin incision was made with scalpel over her preexisting scar and carried through to the underlying layer of fascia. The fascia was incised in the midline, and this incision was extended bilaterally using the Mayo scissors.  Kocher clamps were applied to the superior aspect of the fascial incision and the underlying rectus muscles were dissected off with Mayo scissors.  A similar process was carried out on the inferior aspect of the fascial incision. The rectus muscles was significantly adhered to the fascia.  The rectus muscles were separated in the midline bluntly and the peritoneum was entered bluntly. Attention was turned to the lower uterine segment, where a bladder flap was created on the lower uterine segment; this was significantly thin, and the uterus was entered bluntly.  The infant was successfully delivered, the cord was clamped and cut after one minute, and the infant was handed over to the awaiting neonatology team. Uterine massage was then administered, and the placenta delivered intact with a three-vessel cord. The uterus was then cleared of clots and debris.  The hysterotomy was closed with 0 Vicryl in a running locked fashion, and an imbricating layer was also placed with 0 Vicryl. The pelvis was cleared of all clot and debris. Hemostasis was confirmed on all surfaces.  The peritoneum was closed with a 2-0 Vicryl running stitch. The fascia was then closed using 0 Vicryl.  The subcutaneous layer was irrigated, and 30 ml of 0.5% Marcaine was injected subcutaneously around the incision.  The skin was closed with a 4-0 Vicryl subcuticular stitch.   The patient tolerated the procedure  well. Sponge, lap, instrument and needle counts were correct x 3.  She was taken to the recovery room in stable condition.   Maternal  Disposition: PACU - hemodynamically stable.   Infant Disposition: skin-to-skin with mother  Signed: Frederik Pear, MD OB Fellow 11/09/2017 2:07 PM

## 2017-11-09 NOTE — Anesthesia Preprocedure Evaluation (Signed)
Anesthesia Evaluation  Patient identified by MRN, date of birth, ID band Patient awake    Reviewed: Allergy & Precautions, NPO status , Patient's Chart, lab work & pertinent test results  Airway Mallampati: II  TM Distance: >3 FB Neck ROM: Full    Dental no notable dental hx.    Pulmonary former smoker,    Pulmonary exam normal breath sounds clear to auscultation       Cardiovascular Exercise Tolerance: Good negative cardio ROS Normal cardiovascular exam Rhythm:Regular Rate:Normal     Neuro/Psych Seizures -,  negative psych ROS   GI/Hepatic negative GI ROS, Neg liver ROS,   Endo/Other  negative endocrine ROS  Renal/GU negative Renal ROS     Musculoskeletal   Abdominal   Peds  Hematology negative hematology ROS (+)   Anesthesia Other Findings   Reproductive/Obstetrics (+) Pregnancy                             Anesthesia Physical Anesthesia Plan  ASA: III  Anesthesia Plan: Spinal   Post-op Pain Management:  Regional for Post-op pain   Induction:   PONV Risk Score and Plan: Treatment may vary due to age or medical condition  Airway Management Planned: Mask, Natural Airway and Nasal Cannula  Additional Equipment:   Intra-op Plan:   Post-operative Plan:   Informed Consent: I have reviewed the patients History and Physical, chart, labs and discussed the procedure including the risks, benefits and alternatives for the proposed anesthesia with the patient or authorized representative who has indicated his/her understanding and acceptance.     Plan Discussed with: CRNA and Anesthesiologist  Anesthesia Plan Comments:         Anesthesia Quick Evaluation

## 2017-11-10 ENCOUNTER — Encounter (HOSPITAL_COMMUNITY): Payer: Self-pay

## 2017-11-10 LAB — CBC
HEMATOCRIT: 26.3 % — AB (ref 36.0–46.0)
Hemoglobin: 9 g/dL — ABNORMAL LOW (ref 12.0–15.0)
MCH: 28.7 pg (ref 26.0–34.0)
MCHC: 34.2 g/dL (ref 30.0–36.0)
MCV: 83.8 fL (ref 78.0–100.0)
Platelets: 217 10*3/uL (ref 150–400)
RBC: 3.14 MIL/uL — ABNORMAL LOW (ref 3.87–5.11)
RDW: 14.5 % (ref 11.5–15.5)
WBC: 20.4 10*3/uL — AB (ref 4.0–10.5)

## 2017-11-10 LAB — RPR: RPR Ser Ql: NONREACTIVE

## 2017-11-10 LAB — CREATININE, SERUM
Creatinine, Ser: 0.53 mg/dL (ref 0.44–1.00)
GFR calc Af Amer: >60 mL/min (ref >60)
GFR calc non Af Amer: >60 mL/min (ref >60)

## 2017-11-10 MED ORDER — ENOXAPARIN SODIUM 60 MG/0.6ML ~~LOC~~ SOLN
0.5000 mg/kg | SUBCUTANEOUS | Status: DC
  Administered 2017-11-11: 45 mg via SUBCUTANEOUS
  Filled 2017-11-10 (×3): qty 0.6

## 2017-11-10 MED ORDER — FERROUS SULFATE 325 (65 FE) MG PO TABS
325.0000 mg | ORAL_TABLET | Freq: Two times a day (BID) | ORAL | Status: DC
  Administered 2017-11-10 – 2017-11-12 (×4): 325 mg via ORAL
  Filled 2017-11-10 (×4): qty 1

## 2017-11-10 NOTE — Plan of Care (Signed)
Taking Oxy IR 5 mg occasionally for increased incisional pain. Medication along with Ibuprofen is managing her pain. Ambulation in hallway encouraged.

## 2017-11-10 NOTE — Anesthesia Postprocedure Evaluation (Signed)
Anesthesia Post Note  Patient: Sherry Greene  Procedure(s) Performed: CESAREAN SECTION (Bilateral )     Patient location during evaluation: Mother Baby Anesthesia Type: Spinal Level of consciousness: awake, awake and alert and oriented Pain management: pain level controlled Vital Signs Assessment: post-procedure vital signs reviewed and stable Respiratory status: spontaneous breathing, nonlabored ventilation and respiratory function stable Cardiovascular status: stable Postop Assessment: no headache, no backache, patient able to bend at knees, no apparent nausea or vomiting, adequate PO intake and able to ambulate Anesthetic complications: no    Last Vitals:  Vitals:   11/10/17 0211 11/10/17 0610  BP: 123/83 114/79  Pulse: 71 65  Resp: (!) 24 20  Temp: 36.8 C 36.4 C  SpO2: 100% 100%    Last Pain:  Vitals:   11/10/17 0700  TempSrc:   PainSc: 3    Pain Goal: Patients Stated Pain Goal: 3 (11/10/17 0700)               Patrik Turnbaugh

## 2017-11-10 NOTE — Progress Notes (Signed)
POSTPARTUM PROGRESS NOTE  POD #1  Subjective:  Sherry Greene is a 32 y.o. G3P2002 s/p RLTCS at [redacted]w[redacted]d.  She reports she doing well. No acute events overnight. She reports she is doing well. She denies any problems with ambulating, voiding or po intake. Denies nausea or vomiting. She has not passed flatus. Pain is well controlled.  Lochia is modearte.  Objective: Patient Vitals for the past 18 hrs:  BP Temp Temp src Pulse Resp SpO2  11/10/17 0610 114/79 97.6 F (36.4 C) Oral 65 20 100 %  11/10/17 0211 123/83 98.2 F (36.8 C) Oral 71 (!) 24 100 %  11/09/17 2206 119/81 98.1 F (36.7 C) Oral 82 18 100 %  11/09/17 1838 117/76 98.6 F (37 C) Oral 92 17 100 %  11/09/17 1730 116/90 97.8 F (36.6 C) Oral 93 18 100 %  11/09/17 1708 129/82 - - 89 - -  11/09/17 1643 (!) 141/105 - - - - -  11/09/17 1628 (!) 141/105 - - 89 17 100 %  11/09/17 1535 (!) 149/103 - - - - -  11/09/17 1530 (!) 164/103 97.8 F (36.6 C) Oral 69 17 100 %  11/09/17 1525 (!) 157/97 - - - - -  11/09/17 1515 - - - 77 18 100 %  11/09/17 1500 (!) 156/95 - - 86 16 100 %  11/09/17 1451 (!) 155/96 97.9 F (36.6 C) - 69 14 100 %   Physical Exam:  General: alert, cooperative and no distress Chest: no respiratory distress Heart:regular rate, distal pulses intact Abdomen: soft, nontender,  Uterine Fundus: firm, appropriately tender DVT Evaluation: No calf swelling or tenderness Extremities: no edema Skin: warm, dry; pressure dressing in place  Recent Labs    11/09/17 1053 11/10/17 0538  HGB 10.2* 9.0*  HCT 30.4* 26.3*    Assessment/Plan: Sherry Greene is a 32 y.o. G3P3003 s/p RLTCS at [redacted]w[redacted]d.  POD#1 - Doing welll; pain well controlled. H/H appropriate  Routine postpartum care  OOB, ambulated  Lovenox for VTE prophylaxis  Anemia: asymptomatic  Start po ferrous sulfate BID  Postpartum PreE: Elevated Bps postpartum yesterday. Asymptomatic. UPC 0.33. LFTs/Ptl/Cr wnl. Bps improved this morning, in normal  range  Continue to monitor Bps closely.  Contraception: Nexplanon Feeding: bottle  Dispo: Plan for discharge likely tomorrow   LOS: 1 day   Kandra Nicolas DegeleMD 11/10/2017, 8:46 AM

## 2017-11-11 NOTE — Clinical Social Work Maternal (Signed)
CLINICAL SOCIAL WORK MATERNAL/CHILD NOTE  Patient Details  Name: Sherry Greene MRN: [id number] Date of Birth: 12/13/1985  Date:  11/11/2017  Clinical Social Worker Initiating Note:  Phila Shoaf, LCSW Date/Time: Initiated:  11/10/17/1510     Child's Name:  Preston Tripoli   Biological Parents:  Father, Mother(Hazelene and Preston Calia)   Need for Interpreter:  None   Reason for Referral:  Current Substance Use/Substance Use During Pregnancy    Address:  [address] [place] [address]    Phone number:  [phone] (home)     Additional phone number:   Household Members/Support Persons (HM/SP):   Household Member/Support Person 1, Household Member/Support Person 2, Household Member/Support Person 3, Household Member/Support Person 4   HM/SP Name Relationship DOB or Age  HM/SP -1 Preston Handa Husband/FOB    HM/SP -2   daughter 01/03/10  HM/SP -3   son 10/21/11  HM/SP -4   mother    HM/SP -5        HM/SP -6        HM/SP -7        HM/SP -8          Natural Supports (not living in the home):      Professional Supports: None   Employment:     Type of Work: 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   Education:      Homebound arranged:    Financial Resources:  Private Insurance   Other Resources:      Cultural/Religious Considerations Which May Impact Care: None stated.  Strengths:  Ability to meet basic needs , Home prepared for child , Pediatrician chosen   Psychotropic Medications:         Pediatrician:    [place]  Pediatrician List:   [place]    [place]    [place]    [place] Other([facility])   County    [place]      Pediatrician Fax Number:    Risk Factors/Current Problems:  Substance Use    Cognitive State:  Able to Concentrate , Poor Insight , Alert    Mood/Affect:  Calm , Tearful    CSW Assessment: CSW met with MOB in her  first floor room/126 to offer support and complete assessment due to marijuana use.  MOB was positive on 06/18/18 for THC as well as on 11/09/17.  Baby's UDS was positive for THC.  MOB was alone in the room with baby when CSW arrived.  She was pleasant and receptive to CSW's visit. MOB reports that she and baby are doing well.  She states that her c-section was planned and went well.  She states she has a great support system in her mother and husband, and has everything she needs for baby at home.  She denies any symptoms of PMADs after the births of her first two children and was attentive as CSW provided information and encouraged her to talk with a medical professional if she has concerns at any time.   CSW informed MOB that baby's UDS was positive for marijuana and of mandated reporting to Child Protective Services.  MOB became hysterical and stated that she has been told that "one more time and he said they are going to take my kids."  She added, "my doctor said it was ok.  What was I supposed to do?  Not eat and let my baby die?"  She reports that   she smoked marijuana to support her appetite and baby's growth.  CSW explained that even though she states she informed her MD of her use, it does not make it legal and that substance exposure to an illegal substance, such as marijuana, is a mandatory report to Child Protective Services.  CSW explained what she might expect from CPS involvement and asked her about previous involvement.  She states someone called on her stating she didn't have food in her house.  She reports that a worker came out and saw that there was food.  CSW asked if she has ever had a report substantiated and she said "no."  CSW suggests that if she has never had a case substantiated, then it is unlikely that a report for marijuana is going to cause her to lose custody of her children.  CSW encouraged her to take this one step at a time and talk with her CPS worker before jumping to  conclusions.  MOB remained tearful, but agreed.  She also states her OB "gave me pills to take after I smoked."  CSW asked her if this was nausea medication.  She states it was not, but that she could not recall the name of the medication.  She states she still has the bottle at home and can get the name for CSW.  CSW informed her that CSW will document the name of the medication if MOB provides it.  When CSW followed up with MOB, she stated that her mother was unable to locate the medication bottle at her house.  CSW suggests that she can discuss the medication with her CPS worker and informed her that her worker is Ms. Pulliam and that she should receive a call within the next 3 days.  CSW explained that report should not delay her discharge home with the baby unless CSW receives a call from Ms. Pulliam stating otherwise.  MOB stated understanding.    CSW Plan/Description:  No Further Intervention Required/No Barriers to Discharge, Sudden Infant Death Syndrome (SIDS) Education, Perinatal Mood and Anxiety Disorder (PMADs) Education, Hospital Drug Screen Policy Information, Child Protective Service Report , CSW Will Continue to Monitor Umbilical Cord Tissue Drug Screen Results and Make Report if Warranted    Kristoffer Bala Elizabeth, LCSW 11/11/2017, 3:26 PM 

## 2017-11-11 NOTE — Progress Notes (Signed)
Subjective: Postpartum Day 2: Cesarean Delivery Patient reports incisional pain, tolerating PO, + flatus and no problems voiding.    Objective: Vital signs in last 24 hours: Temp:  [97.5 F (36.4 C)-97.8 F (36.6 C)] 97.5 F (36.4 C) (05/07 0625) Pulse Rate:  [68-87] 87 (05/07 0625) Resp:  [18-20] 18 (05/07 0625) BP: (124-135)/(81-86) 124/81 (05/07 0625) SpO2:  [100 %] 100 % (05/06 1800)  Physical Exam:  General: alert and cooperative Lochia: appropriate Uterine Fundus: firm Incision: slight staining on honeycomb dressing but no erythema DVT Evaluation: No evidence of DVT seen on physical exam. Negative Homan's sign.  Recent Labs    11/09/17 1053 11/10/17 0538  HGB 10.2* 9.0*  HCT 30.4* 26.3*    Assessment/Plan: Status post Cesarean section. Doing well postoperatively.  Continue current care Likely discharge 5/8.  Myrene Buddy 11/11/2017, 9:34 AM

## 2017-11-12 ENCOUNTER — Encounter (HOSPITAL_COMMUNITY): Payer: Self-pay | Admitting: *Deleted

## 2017-11-12 ENCOUNTER — Other Ambulatory Visit: Payer: Self-pay

## 2017-11-12 MED ORDER — OXYCODONE HCL 5 MG PO TABS: 5.0000 mg | ORAL_TABLET | ORAL | 0 refills | Status: DC | PRN

## 2017-11-12 MED ORDER — AMLODIPINE BESYLATE 5 MG PO TABS: 5.0000 mg | ORAL_TABLET | Freq: Every day | ORAL | 0 refills | Status: DC

## 2017-11-12 MED ORDER — IBUPROFEN 600 MG PO TABS: 600.0000 mg | ORAL_TABLET | Freq: Four times a day (QID) | ORAL | 0 refills | Status: DC

## 2017-11-12 MED ORDER — AMLODIPINE BESYLATE 5 MG PO TABS
5.0000 mg | ORAL_TABLET | Freq: Every day | ORAL | Status: DC
  Administered 2017-11-12: 5 mg via ORAL
  Filled 2017-11-12 (×2): qty 1

## 2017-11-12 NOTE — Progress Notes (Signed)
CSW spoke with CPS worker/J. Strand who states she has met with MOB and completed home visit with FOB and other children.  She has completed a Safety Plan and reports no barriers to baby's discharge to parents' care when medically ready.  CSW notified bedside RN. 

## 2017-11-12 NOTE — Discharge Summary (Addendum)
OB Discharge Summary     Patient Name: Sherry Greene DOB: 10-May-1986 MRN: [id number]  Date of admission: 11/09/2017 Delivering MD: Sherry Greene   Date of discharge: 11/12/2017  Admitting diagnosis: PREVIOUS CESAREAN Intrauterine pregnancy: [redacted]w[redacted]d     Secondary diagnosis:  Active Problems:   History of cesarean delivery   Supervision of normal pregnancy   Marijuana use   Late prenatal care   Status post repeat low transverse cesarean section  Additional problems: none     Discharge diagnosis: Term Pregnancy Delivered via c-section                                                                                 Post partum procedures:none  Augmentation: none  Complications: Postpartum pre-eclampsia  Hospital course:  Sceduled C/S   32 y.o. yo G3P2002 at [redacted]w[redacted]d was admitted to the hospital 11/09/2017 for scheduled cesarean section with the following indication:Elective Repeat.  Membrane Rupture Time/Date: 1:14 PM ,11/09/2017   Patient delivered a Viable infant.11/09/2017  Details of operation can be found in separate operative note.  Immediately post c-section patient had elevated blood pressures. Labs showed elevated UPC, and she was diagnosed with pre-eclampsia. Blood pressures were normal on POD#1, but over the next couple of days BP slightly elevated at 130s-140s systolic,a nd she was started on Norvasc 5 mg daily. On day of discharge patient had borderline blood pressure of 140/90.  She is ambulating, tolerating a regular diet, passing flatus, and urinating well. Patient is discharged home in stable condition on  11/12/17         Physical exam  Vitals:   11/10/17 1800 11/11/17 0625 11/11/17 1808 11/12/17 0636  BP: 125/86 124/81 (!) 133/92 (!) 140/95  Pulse: 69 87 88 82  Resp: Temp: 97.6 F (36.4 C) (!) 97.5 F (36.4 C) 97.9 F (36.6 C) (!) 97.5 F (36.4 C)  TempSrc: Oral Oral Oral   SpO2: 100%     Weight:      Height:       General: alert, cooperative  and no distress Lochia: appropriate Uterine Fundus: firm Incision: some slight staining of blood on honeycomb dressing DVT Evaluation: No evidence of DVT seen on physical exam. Negative Homan's sign. Labs: Lab Results  Component Value Date   WBC 20.4 (H) 11/10/2017   HGB 9.0 (L) 11/10/2017   HCT 26.3 (L) 11/10/2017   MCV 83.8 11/10/2017   PLT 217 11/10/2017   CMP Latest Ref Rng & Units 11/10/2017  Glucose 65 - 99 mg/dL -  BUN 6 - 20 mg/dL -  Creatinine 4.09 - 8.11 mg/dL 9.14  Sodium 782 - 956 mmol/L -  Potassium 3.5 - 5.1 mmol/L -  Chloride 101 - 111 mmol/L -  CO2 22 - 32 mmol/L -  Calcium 8.9 - 10.3 mg/dL -  Total Protein 6.5 - 8.1 g/dL -  Total Bilirubin 0.3 - 1.2 mg/dL -  Alkaline Phos 38 - 213 U/L -  AST 15 - 41 U/L -  ALT 14 - 54 U/L -    Discharge instruction: per After Visit Summary and "Baby and Me Booklet".  After visit meds:  Allergies  as of 11/12/2017   No Known Allergies     Medication List    STOP taking these medications   ferrous sulfate 325 (65 FE) MG tablet   levETIRAcetam 500 MG tablet Commonly known as:  KEPPRA   PRENATAL VITAMIN PO   promethazine 25 MG tablet Commonly known as:  PHENERGAN     TAKE these medications   amLODipine 5 MG tablet Commonly known as:  NORVASC Take 1 tablet (5 mg total) by mouth daily.   ibuprofen 600 MG tablet Commonly known as:  ADVIL,MOTRIN Take 1 tablet (600 mg total) by mouth every 6 (six) hours.   oxyCODONE 5 MG immediate release tablet Commonly known as:  Oxy IR/ROXICODONE Take 1 tablet (5 mg total) by mouth every 4 (four) hours as needed (pain scale 4-7).            Discharge Care Instructions  (From admission, onward)        Start     Ordered   11/12/17 0000  Discharge wound care:    Comments:  Ok to shower with honeycomb dressing, When edges start to peel off it is ok to remove the dressing in the shower. At that time can take off all parts of the dressing.   11/12/17 0909      Diet:  routine diet  Activity: Advance as tolerated. Pelvic rest for 6 weeks.   Outpatient follow up:1 week for blood pressure check, 2 weeks for incision check, 6 weeks for postpartum check Follow up Appt: Future Appointments  Date Time Provider Department Center  11/20/2017 10:45 AM Sherry Arms, MD [facility]  12/17/2017 10:30 AM Sherry Greene, CNM [facility]   Follow up Visit:No follow-ups on file.  Postpartum contraception: Nexplanon  Newborn Data: Live born female  Birth Weight: 5 lb 15.4 oz (2705 g) APGAR: 8, 9  Newborn Delivery   Birth date/time:  11/09/2017 13:15:00 Delivery type:  C-Section, Low Transverse Trial of labor:  No C-section categorization:  Repeat     Baby Feeding: Bottle Disposition:home with mother  11/12/2017 Myrene Buddy, MD  OB FELLOW DISCHARGE ATTESTATION  I have seen and examined this patient and agree with above documentation in the resident's note.  Pt doing well postop day 3. BP slightly elevated, and pt started on amlodipine. BP check on Monday.  Incision check in 2 weeks  Frederik Pear, MD OB Fellow 10:42 AM

## 2017-11-13 ENCOUNTER — Encounter: Payer: BLUE CROSS/BLUE SHIELD | Admitting: Women's Health

## 2017-11-20 ENCOUNTER — Encounter: Payer: Self-pay | Admitting: *Deleted

## 2017-11-20 ENCOUNTER — Encounter: Payer: BLUE CROSS/BLUE SHIELD | Admitting: Obstetrics & Gynecology

## 2017-12-17 ENCOUNTER — Ambulatory Visit: Payer: BLUE CROSS/BLUE SHIELD | Admitting: Advanced Practice Midwife

## 2017-12-17 ENCOUNTER — Encounter: Payer: Self-pay | Admitting: *Deleted

## 2018-01-13 ENCOUNTER — Telehealth: Payer: Self-pay | Admitting: Obstetrics & Gynecology

## 2018-01-13 NOTE — Telephone Encounter (Signed)
Patient called stating that she would like to know if we have a copy of her shot records. Please contact pt

## 2018-01-13 NOTE — Telephone Encounter (Signed)
Patient informed the only shot listed for her is the flu vaccine.  States she needs her vaccine record in order to get a job.  Has called the HD and was told to call us.  Number given to [facility] administrative building to speak to someone there.

## 2018-03-26 ENCOUNTER — Telehealth: Payer: Self-pay | Admitting: *Deleted

## 2018-03-26 NOTE — Telephone Encounter (Signed)
Patient called with complaints of a slight opening at her incision site that is now draining green and with a foul odor.  States she is having more intense pain now when she ambulates.  Spoke with JAG who stated she would be willing to see patient tomorrow.  Pt to call back to set up appt after discussing with her manager.

## 2018-03-27 ENCOUNTER — Encounter: Payer: Self-pay | Admitting: Adult Health

## 2018-03-27 ENCOUNTER — Ambulatory Visit (INDEPENDENT_AMBULATORY_CARE_PROVIDER_SITE_OTHER): Payer: BLUE CROSS/BLUE SHIELD | Admitting: Adult Health

## 2018-03-27 VITALS — BP 119/76 | HR 83 | Temp 98.1°F | Ht 64.0 in | Wt 163.5 lb

## 2018-03-27 DIAGNOSIS — Z3009 Encounter for other general counseling and advice on contraception: Secondary | ICD-10-CM | POA: Diagnosis not present

## 2018-03-27 DIAGNOSIS — L7682 Other postprocedural complications of skin and subcutaneous tissue: Secondary | ICD-10-CM | POA: Diagnosis not present

## 2018-03-27 MED ORDER — IBUPROFEN 600 MG PO TABS: 600.0000 mg | ORAL_TABLET | Freq: Four times a day (QID) | ORAL | 1 refills | Status: DC

## 2018-03-27 MED ORDER — CEPHALEXIN 500 MG PO CAPS: 500.0000 mg | ORAL_CAPSULE | Freq: Four times a day (QID) | ORAL | 0 refills | Status: DC

## 2018-03-27 NOTE — Progress Notes (Signed)
  Subjective:     Patient ID: Sherry Greene, female   DOB: 12/27/1985, 32 y.o.   MRN: [id number]  HPI Sherry FosterBrittany is a 32 year old black female in complaining of pain at C section scar, she had C section 11/09/17, and has had greenish to white drainage, none now. She wants to discuss birth control too, like IUD or nexplanon. She never retired for postpartum visit.  Review of Systems Pain at C-section scar Has had drainage Reviewed past medical,surgical, social and family history. Reviewed medications and allergies.     Objective:   Physical Exam BP 119/76 (BP Location: Left Arm, Patient Position: Sitting, Cuff Size: Normal)   Pulse 83   Temp 98.1 F (36.7 C)   Ht 5\' 4"  (1.626 m)   Wt 163 lb 8 oz (74.2 kg)   LMP 03/20/2018   Breastfeeding? No   BMI 28.06 kg/m   Skin warm and dry, abdomen is soft, and tender at left edge of C section scar, there is some granulation tissue but no redness or drainage.Will Rx anitniotic and get US to assess for any fluid collection.  Discussed IUD and nexplanon and she is thinking IUD, will get insurance checked.     Assessment:     1. Pain at surgical incision   2. Contraceptive education       Plan:    Return in 1 week for GYN US to assess for fluid collection Meds ordered this encounter  Medications  . ibuprofen (ADVIL,MOTRIN) 600 MG tablet    Sig: Take 1 tablet (600 mg total) by mouth every 6 (six) hours.    Dispense:  30 tablet    Refill:  1    Order Specific Question:   Supervising Provider    Answer:   Despina HiddenEURE, LUTHER H [[id number]]  . cephALEXin (KEFLEX) 500 MG capsule    Sig: Take 1 capsule (500 mg total) by mouth 4 (four) times daily.    Dispense:  40 capsule    Refill:  0    Order Specific Question:   Supervising Provider    Answer:   Lazaro ArmsEURE, LUTHER H [[id number]]  Review handouts on IUD and nexplanon

## 2018-04-03 ENCOUNTER — Ambulatory Visit: Payer: BLUE CROSS/BLUE SHIELD | Admitting: Adult Health

## 2018-04-07 ENCOUNTER — Other Ambulatory Visit: Payer: BLUE CROSS/BLUE SHIELD

## 2018-10-22 ENCOUNTER — Emergency Department (HOSPITAL_COMMUNITY): Payer: BLUE CROSS/BLUE SHIELD

## 2018-10-22 ENCOUNTER — Other Ambulatory Visit: Payer: Self-pay

## 2018-10-22 ENCOUNTER — Encounter (HOSPITAL_COMMUNITY): Payer: Self-pay | Admitting: Emergency Medicine

## 2018-10-22 ENCOUNTER — Emergency Department (HOSPITAL_COMMUNITY)
Admission: EM | Admit: 2018-10-22 | Discharge: 2018-10-22 | Disposition: A | Discharge status: Home / Self Care | Payer: BLUE CROSS/BLUE SHIELD | Attending: Emergency Medicine | Admitting: Emergency Medicine

## 2018-10-22 DIAGNOSIS — Y939 Activity, unspecified: Secondary | ICD-10-CM | POA: Insufficient documentation

## 2018-10-22 DIAGNOSIS — Y929 Unspecified place or not applicable: Secondary | ICD-10-CM | POA: Diagnosis not present

## 2018-10-22 DIAGNOSIS — S0990XA Unspecified injury of head, initial encounter: Secondary | ICD-10-CM | POA: Diagnosis not present

## 2018-10-22 DIAGNOSIS — M546 Pain in thoracic spine: Secondary | ICD-10-CM

## 2018-10-22 DIAGNOSIS — Y999 Unspecified external cause status: Secondary | ICD-10-CM | POA: Diagnosis not present

## 2018-10-22 DIAGNOSIS — T1490XA Injury, unspecified, initial encounter: Secondary | ICD-10-CM

## 2018-10-22 DIAGNOSIS — M25511 Pain in right shoulder: Secondary | ICD-10-CM | POA: Diagnosis not present

## 2018-10-22 DIAGNOSIS — M542 Cervicalgia: Secondary | ICD-10-CM | POA: Diagnosis not present

## 2018-10-22 DIAGNOSIS — F17210 Nicotine dependence, cigarettes, uncomplicated: Secondary | ICD-10-CM | POA: Insufficient documentation

## 2018-10-22 MED ORDER — HYDROCODONE-ACETAMINOPHEN 5-325 MG PO TABS
1.0000 | ORAL_TABLET | Freq: Once | ORAL | Status: AC
  Administered 2018-10-22: 1 via ORAL
  Filled 2018-10-22: qty 1

## 2018-10-22 MED ORDER — CYCLOBENZAPRINE HCL 10 MG PO TABS: 10.0000 mg | ORAL_TABLET | Freq: Two times a day (BID) | ORAL | 0 refills | Status: DC | PRN
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
## 2018-10-22 NOTE — ED Notes (Signed)
Pt to CT and xray  
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
## 2021-11-12 NOTE — Congregational Nurse Program (Deleted)
Patient presented to onsite [employer] for return visit and received 16.7 lbs of food for family of 4 ?

## 2021-11-12 NOTE — Congregational Nurse Program (Signed)
Patient presented to onsite [employer] for return visit and received 16.7 lbs of food for family of 4 ?

## 2022-05-02 ENCOUNTER — Emergency Department (HOSPITAL_COMMUNITY)
Admission: EM | Admit: 2022-05-02 | Discharge: 2022-05-02 | Disposition: A | Discharge status: Home / Self Care | Payer: Medicaid Other | Attending: Emergency Medicine | Admitting: Emergency Medicine

## 2022-05-02 ENCOUNTER — Other Ambulatory Visit: Payer: Self-pay

## 2022-05-02 ENCOUNTER — Encounter (HOSPITAL_COMMUNITY): Payer: Self-pay

## 2022-05-02 DIAGNOSIS — J020 Streptococcal pharyngitis: Secondary | ICD-10-CM | POA: Insufficient documentation

## 2022-05-02 DIAGNOSIS — Z1152 Encounter for screening for COVID-19: Secondary | ICD-10-CM | POA: Insufficient documentation

## 2022-05-02 DIAGNOSIS — J029 Acute pharyngitis, unspecified: Secondary | ICD-10-CM | POA: Diagnosis present

## 2022-05-02 DIAGNOSIS — E860 Dehydration: Secondary | ICD-10-CM | POA: Insufficient documentation

## 2022-05-02 LAB — CBC WITH DIFFERENTIAL/PLATELET
Abs Immature Granulocytes: 0.04 10*3/uL (ref 0.00–0.07)
Basophils Absolute: 0.1 10*3/uL (ref 0.0–0.1)
Basophils Relative: 0 %
Eosinophils Absolute: 0.2 10*3/uL (ref 0.0–0.5)
Eosinophils Relative: 2 %
HCT: 35.7 % — ABNORMAL LOW (ref 36.0–46.0)
Hemoglobin: 11.8 g/dL — ABNORMAL LOW (ref 12.0–15.0)
Immature Granulocytes: 0 %
Lymphocytes Relative: 24 %
Lymphs Abs: 3.2 10*3/uL (ref 0.7–4.0)
MCH: 27.4 pg (ref 26.0–34.0)
MCHC: 33.1 g/dL (ref 30.0–36.0)
MCV: 82.8 fL (ref 80.0–100.0)
Monocytes Absolute: 1.3 10*3/uL — ABNORMAL HIGH (ref 0.1–1.0)
Monocytes Relative: 10 %
Neutro Abs: 8.6 10*3/uL — ABNORMAL HIGH (ref 1.7–7.7)
Neutrophils Relative %: 64 %
Platelets: 289 10*3/uL (ref 150–400)
RBC: 4.31 MIL/uL (ref 3.87–5.11)
RDW: 16.9 % — ABNORMAL HIGH (ref 11.5–15.5)
WBC: 13.4 10*3/uL — ABNORMAL HIGH (ref 4.0–10.5)
nRBC: 0 % (ref 0.0–0.2)

## 2022-05-02 LAB — COMPREHENSIVE METABOLIC PANEL
ALT: 15 U/L (ref 0–44)
AST: 13 U/L — ABNORMAL LOW (ref 15–41)
Albumin: 3.4 g/dL — ABNORMAL LOW (ref 3.5–5.0)
Alkaline Phosphatase: 71 U/L (ref 38–126)
Anion gap: 8 (ref 5–15)
BUN: 12 mg/dL (ref 6–20)
CO2: 20 mmol/L — ABNORMAL LOW (ref 22–32)
Calcium: 8.5 mg/dL — ABNORMAL LOW (ref 8.9–10.3)
Chloride: 109 mmol/L (ref 98–111)
Creatinine, Ser: 0.63 mg/dL (ref 0.44–1.00)
GFR, Estimated: >60 mL/min (ref >60)
Glucose, Bld: 94 mg/dL (ref 70–99)
Potassium: 3.5 mmol/L (ref 3.5–5.1)
Sodium: 137 mmol/L (ref 135–145)
Total Bilirubin: 0.4 mg/dL (ref 0.3–1.2)
Total Protein: 7.7 g/dL (ref 6.5–8.1)

## 2022-05-02 LAB — RESP PANEL BY RT-PCR (FLU A&B, COVID) ARPGX2
Influenza A by PCR: NEGATIVE
Influenza B by PCR: NEGATIVE
SARS Coronavirus 2 by RT PCR: NEGATIVE

## 2022-05-02 LAB — GROUP A STREP BY PCR: Group A Strep by PCR: DETECTED — AB

## 2022-05-02 LAB — MONONUCLEOSIS SCREEN: Mono Screen: NEGATIVE

## 2022-05-02 MED ORDER — ONDANSETRON HCL 4 MG/2ML IJ SOLN
4.0000 mg | Freq: Once | INTRAMUSCULAR | Status: AC
  Administered 2022-05-02: 4 mg via INTRAVENOUS
  Filled 2022-05-02: qty 2

## 2022-05-02 MED ORDER — SODIUM CHLORIDE 0.9 % IV BOLUS
1000.0000 mL | Freq: Once | INTRAVENOUS | Status: AC
  Administered 2022-05-02: 1000 mL via INTRAVENOUS

## 2022-05-02 MED ORDER — HYDROMORPHONE HCL 1 MG/ML IJ SOLN
0.5000 mg | Freq: Once | INTRAMUSCULAR | Status: AC
  Administered 2022-05-02: 0.5 mg via INTRAVENOUS
  Filled 2022-05-02: qty 0.5

## 2022-05-02 MED ORDER — PENICILLIN V POTASSIUM 500 MG PO TABS: 500.0000 mg | ORAL_TABLET | Freq: Four times a day (QID) | ORAL | 0 refills | Status: AC

## 2022-05-02 NOTE — ED Triage Notes (Signed)
Pt c/o swollen and sore throat for 2 weeks. Pt right side of neck swollen and painful. Pt is afebrile and decreased po intake due to pain.

## 2022-05-02 NOTE — Discharge Instructions (Signed)
Take 600 mg of Motrin every 6 hours for pain or you can take Tylenol.  Drink plenty of fluids and follow-up with your doctor next week if not improving

## 2022-05-05 NOTE — ED Provider Notes (Signed)
[facility] EMERGENCY DEPARTMENT Provider Note   CSN: [id number] Arrival date & time: 05/02/22  0825     History  Chief Complaint  Patient presents with   Sore Throat    Sherry Greene is a 36 y.o. female.  Patient complains of sore throat.  Patient has a past medical history of obesity  The history is provided by the patient and medical records. No language interpreter was used.  Sore Throat This is a new problem. The problem occurs constantly. The problem has not changed since onset.Pertinent negatives include no chest pain, no abdominal pain and no headaches. Nothing aggravates the symptoms. Nothing relieves the symptoms. She has tried nothing for the symptoms. The treatment provided no relief.       Home Medications Prior to Admission medications   Medication Sig Start Date End Date Taking? Authorizing Provider  penicillin v potassium (VEETID) 500 MG tablet Take 1 tablet (500 mg total) by mouth 4 (four) times daily for 7 days. 05/02/22 05/09/22 Yes Bethann Berkshire, MD  cephALEXin (KEFLEX) 500 MG capsule Take 1 capsule (500 mg total) by mouth 4 (four) times daily. Patient not taking: Reported on 05/02/2022 03/27/18   Adline Potter, NP  cyclobenzaprine (FLEXERIL) 10 MG tablet Take 1 tablet (10 mg total) by mouth 2 (two) times daily as needed for muscle spasms. Patient not taking: Reported on 05/02/2022 10/22/18   Sabas Sous, MD  ibuprofen (ADVIL,MOTRIN) 600 MG tablet Take 1 tablet (600 mg total) by mouth every 6 (six) hours. Patient not taking: Reported on 05/02/2022 03/27/18   Adline Potter, NP      Allergies    Patient has no known allergies.    Review of Systems   Review of Systems  Constitutional:  Negative for appetite change and fatigue.  HENT:  Negative for congestion, ear discharge and sinus pressure.        Sore throat  Eyes:  Negative for discharge.  Respiratory:  Negative for cough.   Cardiovascular:  Negative for chest pain.   Gastrointestinal:  Negative for abdominal pain and diarrhea.  Genitourinary:  Negative for frequency and hematuria.  Musculoskeletal:  Negative for back pain.  Skin:  Negative for rash.  Neurological:  Negative for seizures and headaches.  Psychiatric/Behavioral:  Negative for hallucinations.     Physical Exam Updated Vital Signs BP 128/88   Pulse 77   Temp 98.4 F (36.9 C) (Oral)   Resp 17   Ht 5\' 4"  (1.626 m)   Wt 90.2 kg   SpO2 100%   BMI 34.13 kg/m  Physical Exam Vitals and nursing note reviewed.  Constitutional:      Appearance: She is well-developed.  HENT:     Head: Normocephalic.     Comments: Pharynx inflamed    Nose: Nose normal.  Eyes:     General: No scleral icterus.    Conjunctiva/sclera: Conjunctivae normal.  Neck:     Thyroid: No thyromegaly.  Cardiovascular:     Rate and Rhythm: Normal rate and regular rhythm.     Heart sounds: No murmur heard.    No friction rub. No gallop.  Pulmonary:     Breath sounds: No stridor. No wheezing or rales.  Chest:     Chest wall: No tenderness.  Abdominal:     General: There is no distension.     Tenderness: There is no abdominal tenderness. There is no rebound.  Musculoskeletal:        General: Normal range of motion.  Cervical back: Neck supple.  Lymphadenopathy:     Cervical: No cervical adenopathy.  Skin:    Findings: No erythema or rash.  Neurological:     Mental Status: She is alert and oriented to person, place, and time.     Motor: No abnormal muscle tone.     Coordination: Coordination normal.  Psychiatric:        Behavior: Behavior normal.     ED Results / Procedures / Treatments   Labs (all labs ordered are listed, but only abnormal results are displayed) Labs Reviewed  GROUP A STREP BY PCR - Abnormal; Notable for the following components:      Result Value   Group A Strep by PCR DETECTED (*)    All other components within normal limits  CBC WITH DIFFERENTIAL/PLATELET - Abnormal;  Notable for the following components:   WBC 13.4 (*)    Hemoglobin 11.8 (*)    HCT 35.7 (*)    RDW 16.9 (*)    Neutro Abs 8.6 (*)    Monocytes Absolute 1.3 (*)    All other components within normal limits  COMPREHENSIVE METABOLIC PANEL - Abnormal; Notable for the following components:   CO2 20 (*)    Calcium 8.5 (*)    Albumin 3.4 (*)    AST 13 (*)    All other components within normal limits  RESP PANEL BY RT-PCR (FLU A&B, COVID) [id number]  MONONUCLEOSIS SCREEN    EKG None  Radiology No results found.  Procedures Procedures    Medications Ordered in ED Medications  sodium chloride 0.9 % bolus 1,000 mL (0 mLs Intravenous Stopped 05/02/22 1404)  HYDROmorphone (DILAUDID) injection 0.5 mg (0.5 mg Intravenous Given 05/02/22 1227)  ondansetron (ZOFRAN) injection 4 mg (4 mg Intravenous Given 05/02/22 1227)    ED Course/ Medical Decision Making/ A&P                           Medical Decision Making Amount and/or Complexity of Data Reviewed Labs: ordered.  Risk Prescription drug management.  This patient presents to the ED for concern of sore throat, this involves an extensive number of treatment options, and is a complaint that carries with it a high risk of complications and morbidity.  The differential diagnosis includes strep, viral syndrome, mono   Co morbidities that complicate the patient evaluation  Obesity   Additional history obtained:  Additional history obtained from patient External records from outside source obtained and reviewed including hospital record   Lab Tests:  I Ordered, and personally interpreted labs.  The pertinent results include: Strep positive, mono negative, COVID-negative   Imaging Studies ordered:  No imaging  Cardiac Monitoring: / EKG:  The patient was maintained on a cardiac monitor.  I personally viewed and interpreted the cardiac monitored which showed an underlying rhythm of: Normal sinus rhythm   Consultations  Obtained: No consultant  Problem List / ED Course / Critical interventions / Medication management  Obesity and sore throat I ordered medication including penicillin Motrin for sore throat and strep Reevaluation of the patient after these medicines showed that the patient stayed the same I have reviewed the patients home medicines and have made adjustments as needed   Social Determinants of Health:  None   Test / Admission - Considered:  None  Patient with pharyngitis.  She will be given penicillin and take Motrin for pain.  She will follow-up as needed  Final Clinical Impression(s) / ED Diagnoses Final diagnoses:  Strep pharyngitis  Dehydration    Rx / DC Orders ED Discharge Orders          Ordered    penicillin v potassium (VEETID) 500 MG tablet  4 times daily        05/02/22 1350              Milton Ferguson, MD 05/05/22 1222

## 2023-10-28 ENCOUNTER — Encounter: Payer: Self-pay | Admitting: Physician Assistant

## 2023-10-28 ENCOUNTER — Ambulatory Visit: Admitting: Physician Assistant

## 2023-10-28 VITALS — BP 125/87 | HR 91 | Ht 64.0 in | Wt 215.0 lb

## 2023-10-28 DIAGNOSIS — Z021 Encounter for pre-employment examination: Secondary | ICD-10-CM

## 2023-10-28 DIAGNOSIS — Z0289 Encounter for other administrative examinations: Secondary | ICD-10-CM

## 2023-10-28 NOTE — Patient Instructions (Signed)

## 2023-10-28 NOTE — Progress Notes (Unsigned)
   New Patient Office Visit  Subjective    Patient ID: Sherry Greene, female    DOB: March 05, 1986  Age: 38 y.o. MRN: [id number]  CC: No chief complaint on file.   HPI Sherry Greene presents to establish care ***  Outpatient Encounter Medications as of 10/28/2023  Medication Sig   cephALEXin  (KEFLEX ) 500 MG capsule Take 1 capsule (500 mg total) by mouth 4 (four) times daily. (Patient not taking: Reported on 05/02/2022)   cyclobenzaprine  (FLEXERIL ) 10 MG tablet Take 1 tablet (10 mg total) by mouth 2 (two) times daily as needed for muscle spasms. (Patient not taking: Reported on 05/02/2022)   ibuprofen  (ADVIL ,MOTRIN ) 600 MG tablet Take 1 tablet (600 mg total) by mouth every 6 (six) hours. (Patient not taking: Reported on 05/02/2022)   No facility-administered encounter medications on file as of 10/28/2023.    Past Medical History:  Diagnosis Date   Seizures (HCC)    1 month ago    Past Surgical History:  Procedure Laterality Date   CESAREAN SECTION     CESAREAN SECTION Bilateral 11/09/2017   Procedure: CESAREAN SECTION;  Surgeon: Tresia Fruit, MD;  Location: [facility] BIRTHING SUITES;  Service: Obstetrics;  Laterality: Bilateral;    Family History  Problem Relation Age of Onset   Diabetes Maternal Grandmother    Cancer Maternal Grandmother    Cancer Maternal Grandfather        lung    Asthma Mother    Bronchiolitis Mother    Kidney disease Son        born with one kidney   Asthma Son     Social History   Socioeconomic History   Marital status: Married    Spouse name: Not on file   Number of children: Not on file   Years of education: Not on file   Highest education level: Not on file  Occupational History   Not on file  Tobacco Use   Smoking status: Some Days    Types: Cigars   Smokeless tobacco: Never  Substance and Sexual Activity   Alcohol use: Not Currently    Comment: occasionally   Drug use: Yes    Types: Marijuana   Sexual activity: Yes    Birth  control/protection: Condom  Other Topics Concern   Not on file  Social History Narrative   Not on file   Social Drivers of Health   Financial Resource Strain: Not on file  Food Insecurity: Food Insecurity Present (10/28/2023)   Hunger Vital Sign    Worried About Running Out of Food in the Last Year: Often true    Ran Out of Food in the Last Year: Often true  Transportation Needs: Not on file  Physical Activity: Not on file  Stress: Not on file  Social Connections: Not on file  Intimate Partner Violence: Not on file    ROS      Objective    There were no vitals taken for this visit.  Physical Exam  {Labs (Optional):23779}    Assessment & Plan:   Problem List Items Addressed This Visit   None   No follow-ups on file.   Etter Hermann Mayers, PA-C

## 2023-10-28 NOTE — Congregational Nurse Program (Signed)
 Pt requested a resource to obtain a health examination for her job in addition to a health form to be completed as a requirement   PLAN Assisted pt in coordinating and obtaining an appointment with the [facility] Bertrand Brod)  Pt was able to obtain an appointment for (Tuesday) 4.22.25 at 5:20 pm at location of [employer], [address], [place]  Pt was grateful and then left the clinic

## 2023-11-11 ENCOUNTER — Ambulatory Visit: Payer: Self-pay

## 2023-11-30 ENCOUNTER — Emergency Department (HOSPITAL_COMMUNITY)
Admission: EM | Admit: 2023-11-30 | Discharge: 2023-11-30 | Disposition: A | Discharge status: Home / Self Care | Attending: Emergency Medicine | Admitting: Emergency Medicine

## 2023-11-30 ENCOUNTER — Encounter (HOSPITAL_COMMUNITY): Payer: Self-pay | Admitting: *Deleted

## 2023-11-30 DIAGNOSIS — K0401 Reversible pulpitis: Secondary | ICD-10-CM | POA: Insufficient documentation

## 2023-11-30 DIAGNOSIS — K0889 Other specified disorders of teeth and supporting structures: Secondary | ICD-10-CM | POA: Diagnosis present

## 2023-11-30 MED ORDER — AMOXICILLIN 500 MG PO CAPS: 500.0000 mg | ORAL_CAPSULE | Freq: Three times a day (TID) | ORAL | 0 refills | Status: AC

## 2023-11-30 MED ORDER — IBUPROFEN 600 MG PO TABS: 600.0000 mg | ORAL_TABLET | Freq: Four times a day (QID) | ORAL | 1 refills | Status: AC

## 2023-11-30 NOTE — ED Triage Notes (Signed)
 Pt with right facial pain x 3 days. Has an appt with dentist not til June. Denies any fevers.

## 2023-11-30 NOTE — ED Provider Notes (Signed)
 [facility] EMERGENCY DEPARTMENT AT [facility] Provider Note   CSN: [id number] Arrival date & time: 11/30/23  1028     History  Chief Complaint  Patient presents with   Facial Pain    Sherry Greene is a 38 y.o. female.  HPI   38 year old female with dental pain in the right lower jaw, no swelling of the face but has had pain for 3 days with now some earache on the right.  No tenderness with manipulation of the ear, no fevers or chills, she has an appointment to see a dentist in 1 month, taking over-the-counter medications without much relief.  Denies fevers  Home Medications Prior to Admission medications   Medication Sig Start Date End Date Taking? Authorizing Provider  amoxicillin (AMOXIL) 500 MG capsule Take 1 capsule (500 mg total) by mouth 3 (three) times daily. 11/30/23  Yes Early Glisson, MD  ibuprofen  (ADVIL ) 600 MG tablet Take 1 tablet (600 mg total) by mouth every 6 (six) hours. 11/30/23   Early Glisson, MD      Allergies    Patient has no known allergies.    Review of Systems   Review of Systems  All other systems reviewed and are negative.   Physical Exam Updated Vital Signs BP (!) 146/81 (BP Location: Right Arm)   Pulse 81   Temp 98.2 F (36.8 C) (Oral)   Resp 16   Ht 1.626 m (5\' 4" )   Wt 104.3 kg   LMP 11/16/2023   SpO2 100%   BMI 39.48 kg/m  Physical Exam Vitals and nursing note reviewed.  Constitutional:      General: She is not in acute distress.    Appearance: She is well-developed. She is not diaphoretic.  HENT:     Head: Normocephalic and atraumatic.     Mouth/Throat:     Pharynx: No oropharyngeal exudate.     Comments: Dentition evaluated, deep cavity to the molar of the right lower jaw with tenderness but no gingival swelling, no swelling under the tongue, no lingual swelling or protrusion, normal phonation, no trismus or torticollis, no lymphadenopathy of the neck.  Normal tympanic membrane on the right Eyes:     General:  No scleral icterus.    Conjunctiva/sclera: Conjunctivae normal.  Neck:     Thyroid: No thyromegaly.  Cardiovascular:     Rate and Rhythm: Normal rate and regular rhythm.  Pulmonary:     Effort: Pulmonary effort is normal.     Breath sounds: Normal breath sounds.  Musculoskeletal:     Cervical back: Normal range of motion and neck supple.  Lymphadenopathy:     Cervical: No cervical adenopathy.  Skin:    General: Skin is warm and dry.     Findings: No rash.  Neurological:     Mental Status: She is alert.     ED Results / Procedures / Treatments   Labs (all labs ordered are listed, but only abnormal results are displayed) Labs Reviewed - No data to display  EKG None  Radiology No results found.  Procedures Procedures    Medications Ordered in ED Medications - No data to display  ED Course/ Medical Decision Making/ A&P                                 Medical Decision Making  Dental pain, likely early infection or pulpitis Vital signs reviewed, unremarkable No signs of Ludwig's angina  Oxacillin anti-inflammatories at home with dental follow-up, patient agreeable        Final Clinical Impression(s) / ED Diagnoses Final diagnoses:  Pulpitis    Rx / DC Orders ED Discharge Orders          Ordered    ibuprofen  (ADVIL ) 600 MG tablet  Every 6 hours        11/30/23 1129    amoxicillin (AMOXIL) 500 MG capsule  3 times daily        11/30/23 1129              Early Glisson, MD 11/30/23 1130

## 2023-11-30 NOTE — Discharge Instructions (Signed)
 Please take Amoxicillin exactly as prescribed, this is used to treat bacterial infections, it treats a variety of infections including strep throat, ear infections, and a few other bacterial infections.   Do not take this medication if you are allergic to amoxicillin or penicillin .  effects of medications such as antibiotics include diarrhea which may occur as well as potentially inactivating birth control so if you are using a birth control pill please use an alternative form of birth control for the next 2 weeks.  There is occasions where this antibiotic does not work so if you are not improving within 48 hours you will need to be reevaluated immediately by your doctor or in the emergency department if your symptoms are worsening  Ibuprofen  3 times a day as needed for pain  See your dentist in follow-up, if they have an earlier appointment I would asked them to get you in earlier  Return to the ER for severe pain pain swelling fever or difficulty swallowing
